# Patient Record
Sex: Female | Born: 1970
Health system: Western US, Academic
[De-identification: ages and names within clinical notes are randomized; demographics above are authoritative.]

## PROBLEM LIST (undated history)

## (undated) DIAGNOSIS — M1990 Unspecified osteoarthritis, unspecified site: Secondary | ICD-10-CM

## (undated) HISTORY — PX: FOOT SURGERY: SHX648

## (undated) HISTORY — DX: Unspecified osteoarthritis, unspecified site: M19.90

---

## 1998-04-16 ENCOUNTER — Encounter: Admission: RE | Admit: 1998-04-16 | Discharge: 1998-04-16 | Payer: Self-pay | Admitting: *Deleted

## 1999-12-22 ENCOUNTER — Other Ambulatory Visit: Admission: RE | Admit: 1999-12-22 | Discharge: 1999-12-22 | Payer: Self-pay | Admitting: Obstetrics and Gynecology

## 2001-01-14 ENCOUNTER — Other Ambulatory Visit: Admission: RE | Admit: 2001-01-14 | Discharge: 2001-01-14 | Payer: Self-pay | Admitting: Obstetrics and Gynecology

## 2002-06-23 ENCOUNTER — Other Ambulatory Visit: Admission: RE | Admit: 2002-06-23 | Discharge: 2002-06-23 | Payer: Self-pay | Admitting: Obstetrics and Gynecology

## 2003-11-23 ENCOUNTER — Other Ambulatory Visit: Admission: RE | Admit: 2003-11-23 | Discharge: 2003-11-23 | Payer: Self-pay | Admitting: Obstetrics and Gynecology

## 2005-02-13 ENCOUNTER — Other Ambulatory Visit: Admission: RE | Admit: 2005-02-13 | Discharge: 2005-02-13 | Payer: Self-pay | Admitting: Obstetrics and Gynecology

## 2011-01-10 ENCOUNTER — Inpatient Hospital Stay (INDEPENDENT_AMBULATORY_CARE_PROVIDER_SITE_OTHER)
Admission: RE | Admit: 2011-01-10 | Discharge: 2011-01-10 | Disposition: A | Discharge status: Home / Self Care | Payer: BC Managed Care – PPO | Source: Ambulatory Visit | Attending: Family Medicine | Admitting: Family Medicine

## 2011-01-10 DIAGNOSIS — G510 Bell's palsy: Secondary | ICD-10-CM

## 2015-11-09 ENCOUNTER — Other Ambulatory Visit: Payer: Self-pay | Admitting: Obstetrics and Gynecology

## 2015-11-09 DIAGNOSIS — R928 Other abnormal and inconclusive findings on diagnostic imaging of breast: Secondary | ICD-10-CM

## 2015-11-26 ENCOUNTER — Ambulatory Visit
Admission: RE | Admit: 2015-11-26 | Discharge: 2015-11-26 | Disposition: A | Discharge status: Home / Self Care | Payer: BLUE CROSS/BLUE SHIELD | Source: Ambulatory Visit | Attending: Obstetrics and Gynecology | Admitting: Obstetrics and Gynecology

## 2015-11-26 DIAGNOSIS — R928 Other abnormal and inconclusive findings on diagnostic imaging of breast: Secondary | ICD-10-CM

## 2016-01-24 ENCOUNTER — Ambulatory Visit (HOSPITAL_COMMUNITY)
Admission: EM | Admit: 2016-01-24 | Discharge: 2016-01-24 | Disposition: A | Discharge status: Home / Self Care | Payer: BLUE CROSS/BLUE SHIELD | Attending: Family Medicine | Admitting: Family Medicine

## 2016-01-24 ENCOUNTER — Encounter (HOSPITAL_COMMUNITY): Payer: Self-pay | Admitting: Emergency Medicine

## 2016-01-24 DIAGNOSIS — L259 Unspecified contact dermatitis, unspecified cause: Secondary | ICD-10-CM

## 2016-01-24 MED ORDER — TRIAMCINOLONE ACETONIDE 40 MG/ML IJ SUSP
40.0000 mg | Freq: Once | INTRAMUSCULAR | Status: AC
  Administered 2016-01-24: 40 mg via INTRAMUSCULAR

## 2016-01-24 MED ORDER — PREDNISONE 50 MG PO TABS: ORAL_TABLET | ORAL | Status: DC

## 2016-01-24 MED ORDER — TRIAMCINOLONE ACETONIDE 40 MG/ML IJ SUSP
INTRAMUSCULAR | Status: AC
  Filled 2016-01-24: qty 1

## 2016-01-24 MED ORDER — TRIAMCINOLONE ACETONIDE 0.1 % EX CREA: 1.0000 "application " | TOPICAL_CREAM | Freq: Two times a day (BID) | CUTANEOUS | Status: DC

## 2016-01-24 NOTE — ED Notes (Signed)
The patient [facility] with a complaint of a rash that started on her face on Dec 23, 2015 that she treated with calamine lotion and benadryl. The patient stated that the calamine lotion started to burn so she switched to a hydrocortisone cream. She stated that the rash has now spread to her chest and neck.

## 2016-01-24 NOTE — ED Provider Notes (Signed)
CSN: [id number]     Arrival date & time 01/24/16  1557 History   First MD Initiated Contact with Patient 01/24/16 1720     Chief Complaint  Patient presents with  . Rash   (Consider location/radiation/quality/duration/timing/severity/associated sxs/prior Treatment) Patient is a 45 y.o. female presenting with rash. The history is provided by the patient.  Rash Location:  Face, torso, shoulder/arm and head/neck Head/neck rash location:  R neck and L neck Facial rash location:  Face Shoulder/arm rash location:  R forearm and L forearm Quality: dryness, itchiness and scaling   Severity:  Moderate Duration:  1 month Progression:  Worsening Chronicity:  New Ineffective treatments:  Antihistamines Associated symptoms: no fever, no sore throat, no throat swelling and no tongue swelling     History reviewed. No pertinent past medical history. Past Surgical History  Procedure Laterality Date  . Foot surgery     History reviewed. No pertinent family history. Social History  Substance Use Topics  . Smoking status: Never Smoker   . Smokeless tobacco: None  . Alcohol Use: No   OB History    No data available     Review of Systems  Constitutional: Negative.  Negative for fever.  HENT: Negative for sore throat.   Musculoskeletal: Negative.   Skin: Positive for rash. Negative for wound.  All other systems reviewed and are negative.   Allergies  Penicillins  Home Medications   Prior to Admission medications   Medication Sig Start Date End Date Taking? Authorizing Provider  predniSONE (DELTASONE) 50 MG tablet 1 tab daily for 2 days then 1/2 tab daily for 2 days. Start on tues, take until finished 01/24/16   Linna HoffJames D Kindl, MD  triamcinolone cream (KENALOG) 0.1 % Apply 1 application topically 2 (two) times daily. 01/24/16   Linna HoffJames D Kindl, MD   Meds Ordered and Administered this Visit   Medications  triamcinolone acetonide (KENALOG-40) injection 40 mg (not administered)    BP  153/96 mmHg  Pulse 71  Temp(Src) 98.2 F (36.8 C) (Oral)  Resp 18  SpO2 99% No data found.   Physical Exam  Constitutional: She is oriented to person, place, and time. She appears well-developed and well-nourished. No distress.  HENT:  Mouth/Throat: Oropharynx is clear and moist.  Neck: Normal range of motion. Neck supple.  Musculoskeletal: Normal range of motion.  Lymphadenopathy:    She has no cervical adenopathy.  Neurological: She is alert and oriented to person, place, and time.  Skin: Skin is warm and dry. Rash noted.  Dry scaly lesions to affected skin nontender, no infection.  Nursing note and vitals reviewed.   ED Course  Procedures (including critical care time)  Labs Review Labs Reviewed - No data to display  Imaging Review No results found.   Visual Acuity Review  Right Eye Distance:   Left Eye Distance:   Bilateral Distance:    Right Eye Near:   Left Eye Near:    Bilateral Near:         MDM   1. Contact dermatitis        Linna HoffJames D Kindl, MD 01/24/16 1759

## 2016-02-09 ENCOUNTER — Ambulatory Visit (INDEPENDENT_AMBULATORY_CARE_PROVIDER_SITE_OTHER): Payer: BLUE CROSS/BLUE SHIELD | Admitting: Family Medicine

## 2016-02-09 ENCOUNTER — Encounter: Payer: Self-pay | Admitting: Family Medicine

## 2016-02-09 DIAGNOSIS — Z13220 Encounter for screening for lipoid disorders: Secondary | ICD-10-CM

## 2016-02-09 DIAGNOSIS — Z6833 Body mass index (BMI) 33.0-33.9, adult: Secondary | ICD-10-CM | POA: Diagnosis not present

## 2016-02-09 DIAGNOSIS — Z23 Encounter for immunization: Secondary | ICD-10-CM

## 2016-02-09 DIAGNOSIS — Z131 Encounter for screening for diabetes mellitus: Secondary | ICD-10-CM

## 2016-02-09 DIAGNOSIS — Z0000 Encounter for general adult medical examination without abnormal findings: Secondary | ICD-10-CM | POA: Diagnosis not present

## 2016-02-09 NOTE — Addendum Note (Signed)
Addended by: Marcell AngerSELF, Danine Hor E on: 02/09/2016 08:46 AM   Modules accepted: Orders

## 2016-02-09 NOTE — Patient Instructions (Signed)
A few things to remember from today's visit:   Routine physical examination - Plan: Lipid panel, Basic metabolic panel  Lipid screening - Plan: Lipid panel  Diabetes mellitus screening - Plan: Basic metabolic panel     At least 150 minutes of moderate exercise per week, daily brisk walking for 15-30 min is a good exercise option. Healthy diet low in saturated (animal) fats and sweets and consisting of fresh fruits and vegetables, lean meats such as fish and white chicken and whole grains.   - Vaccines:  Tdap vaccine every 10 years.  Shingles vaccine recommended at age 45, could be given after 45 years of age but not sure about insurance coverage.  Pneumonia vaccines:  Prevnar 13 at 65 and Pneumovax at 66.  Screening recommendations for low/normal risk women:  Screening for diabetes at age 45-45 and every 3 years.  Cervical cancer prevention:   Pap smear every 3 years between women 30 and older if pap smear negative and HPV screening negative.   -Breast cancer: Mammogram: There is disagreement between experts about when to start screening in low risk asymptomatic female (40-45-50 years). > 45 years old every 2 years.   Colon cancer screening: starts at 45 years old in your case until 45 years old.  Cholesterol disorder screening at age 45-45 and every 3 years.    If a new problem present, please set up appointment sooner than planned today.

## 2016-02-09 NOTE — Progress Notes (Signed)
HPI:   [name] is a 45 y.o. female, who is here today to establish care with me. She is also having her routine preventive visit today.   Former PCP: has not had one before. Last preventive routine visit: 10/2015 gyn exam (Dr Marcelle Overlie), has not had CPE in years.   She is otherwise healthy, she is on Depo-Provera, which she has taken intermittently for about 15 years for contraception. No other chronic medication.  Pap smear 10/2015. Mammogram 10/2015.  Lipid and diabetes screening never. No tobacco use. No high alcohol intake.  She does not  follow a healthy diet and does not exercise regularly. Lives with husband, no children.  FHx for colon cancer negative. FHx for breast cancer maternal aunt.  Concerns today: None  She is not fasting today, drank a Mountain dew today.     Review of Systems  Constitutional: Negative for fever, appetite change, fatigue and unexpected weight change.  HENT: Negative for hearing loss, mouth sores, nosebleeds, trouble swallowing and voice change.   Eyes: Negative for photophobia and visual disturbance.  Respiratory: Negative for cough, shortness of breath and wheezing.   Cardiovascular: Negative for chest pain, palpitations and leg swelling.  Gastrointestinal: Negative for nausea, vomiting and abdominal pain.       No changes in bowel habits.  Endocrine: Negative for cold intolerance, heat intolerance, polydipsia, polyphagia and polyuria.  Genitourinary: Negative for dysuria, hematuria, decreased urine volume, vaginal bleeding and vaginal discharge.       LMP 11/21/2015, on Depo Provera.  Musculoskeletal: Negative for back pain, arthralgias and neck pain.  Skin: Negative for color change and rash.  Allergic/Immunologic: Negative for environmental allergies and food allergies.  Neurological: Negative for seizures, syncope, weakness, numbness and headaches.  Psychiatric/Behavioral: Negative for confusion and sleep disturbance.  The patient is not nervous/anxious.   All other systems reviewed and are negative.     Current Outpatient Prescriptions on File Prior to Visit  Medication Sig Dispense Refill  . triamcinolone cream (KENALOG) 0.1 % Apply 1 application topically 2 (two) times daily. 45 g 0   No current facility-administered medications on file prior to visit.     History reviewed. No pertinent past medical history. Allergies  Allergen Reactions  . Penicillins     Family History  Problem Relation Age of Onset  . Hypertension Mother   . Diabetes Mother   . Hypertension Father   . Cancer Maternal Aunt   . Diabetes Paternal Aunt   . Diabetes Maternal Grandmother   . Hyperlipidemia Paternal Grandmother   . Diabetes Paternal Grandmother     Social History   Social History  . Marital Status: Married    Spouse Name: N/A  . Number of Children: N/A  . Years of Education: N/A   Social History Main Topics  . Smoking status: Never Smoker   . Smokeless tobacco: None  . Alcohol Use: No  . Drug Use: None  . Sexual Activity: Not Asked   Other Topics Concern  . None   Social History Narrative    Filed Vitals:   02/09/16 0749  BP: 125/80  Pulse: 66  Temp: 98.8 F (37.1 C)  Resp: 12    Body mass index is 33.65 kg/(m^2).   SpO2 Readings from Last 3 Encounters:  02/09/16 98%  01/24/16 99%      Physical Exam  Constitutional: She is oriented to person, place, and time. She appears well-developed. No distress.  HENT:  Head:  Atraumatic.  Right Ear: Hearing, tympanic membrane, external ear and ear canal normal.  Left Ear: Hearing, tympanic membrane, external ear and ear canal normal.  Mouth/Throat: Uvula is midline, oropharynx is clear and moist and mucous membranes are normal.  Eyes: Conjunctivae and EOM are normal. Pupils are equal, round, and reactive to light.  Neck: No tracheal deviation present. No thyromegaly present.  Cardiovascular: Normal rate and regular rhythm.   No  murmur heard. Pulses:      Dorsalis pedis pulses are 2+ on the right side, and 2+ on the left side.       Posterior tibial pulses are 2+ on the right side, and 2+ on the left side.  Respiratory: Effort normal and breath sounds normal. No respiratory distress.  GI: Soft. She exhibits no mass. There is no tenderness.  Musculoskeletal: She exhibits no edema.  No major deformity or sing of synovitis appreciated.  Lymphadenopathy:    She has no cervical adenopathy.       Right: No supraclavicular adenopathy present.       Left: No supraclavicular adenopathy present.  Neurological: She is alert and oriented to person, place, and time. She has normal strength. No cranial nerve deficit. Coordination and gait normal.  Reflex Scores:      Bicep reflexes are 2+ on the right side and 2+ on the left side.      Patellar reflexes are 2+ on the right side and 2+ on the left side. Skin: Skin is warm. No erythema.  Psychiatric: Her speech is normal. Her mood appears anxious.      ASSESSMENT AND PLAN:     Theresa Mitchell was seen today for new patient (initial visit).  Diagnoses and all orders for this visit:  Routine physical examination   We discussed the importance of regular physical activity and healthy diet for prevention of chronic illness and/or complications. Preventive guidelines reviewed. She will continue female preventive care with Dr Marcelle OverlieHolland. Some general recommendations about vaccination discussed. Adequate Ca++ supplementation , 1000 mg daily, PPI daily through diet, given the fact she has been on Depo-Provera for years. Next CPE in 1 year.  -     Lipid panel; Future -     Basic metabolic panel; Future  Lipid screening -     Lipid panel; Future  Diabetes mellitus screening -     Basic metabolic panel; Future  BMI 33.0-33.9,adult We discussed benefits of wt loss as well as adverse effects of obesity. Consistency with healthy diet and physical activity recommended. Weight  Watchers is a good option as well as daily brisk walking for 15-30 min as tolerated.      She is not fasting today, so she will be back for fasting lab in about 2-3 weeks. I will continue seeing her annually, before if needed.         Armie Moren G. [place], MD  [facility]. [place] office.

## 2016-02-09 NOTE — Progress Notes (Signed)
Pre visit review using our clinic review tool, if applicable. No additional management support is needed unless otherwise documented below in the visit note. 

## 2016-03-08 ENCOUNTER — Other Ambulatory Visit: Payer: BLUE CROSS/BLUE SHIELD

## 2016-03-20 ENCOUNTER — Encounter: Payer: Self-pay | Admitting: Family Medicine

## 2017-08-04 IMAGING — MG MM DIAG BREAST TOMO UNI RIGHT
6 series · 6 of 14 positions shown · non-contrast
Comparison: Screening study, 11/04/2015

CLINICAL DATA: Screening recall for a possible asymmetry in the
right breast.

EXAM:
2D DIGITAL DIAGNOSTIC UNILATERAL RIGHT MAMMOGRAM WITH CAD AND
ADJUNCT TOMO

[R CC synth-2D]
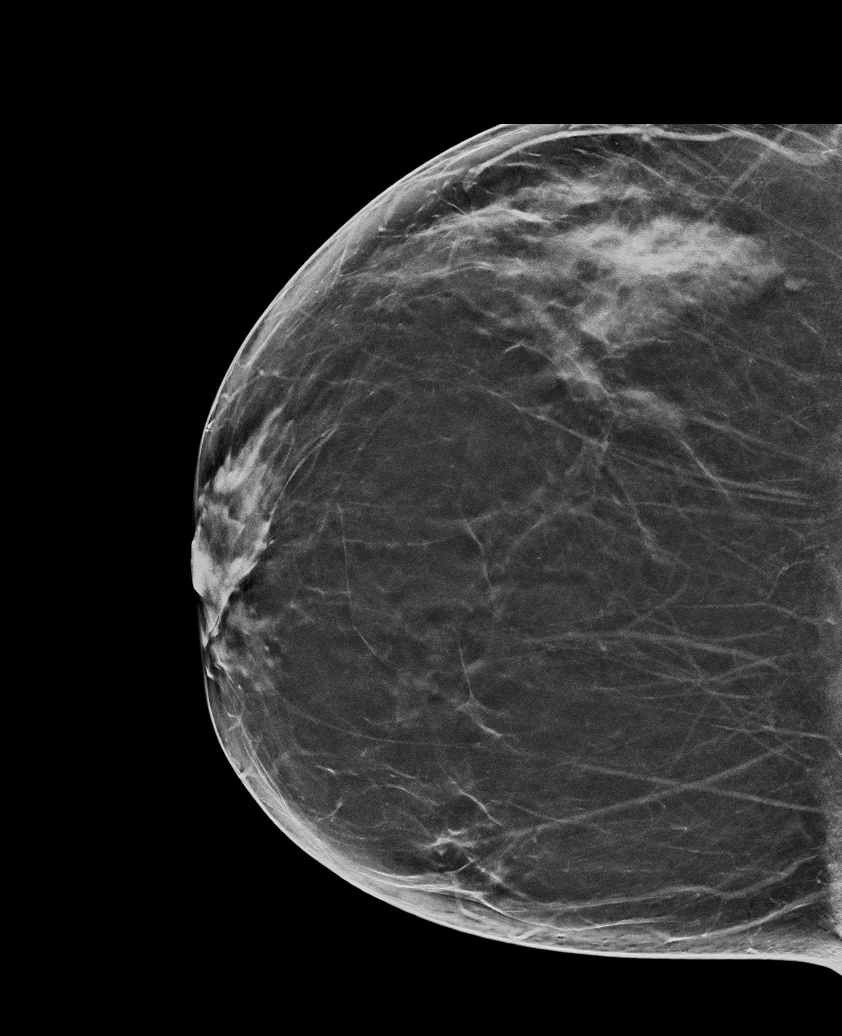

[R MLO]
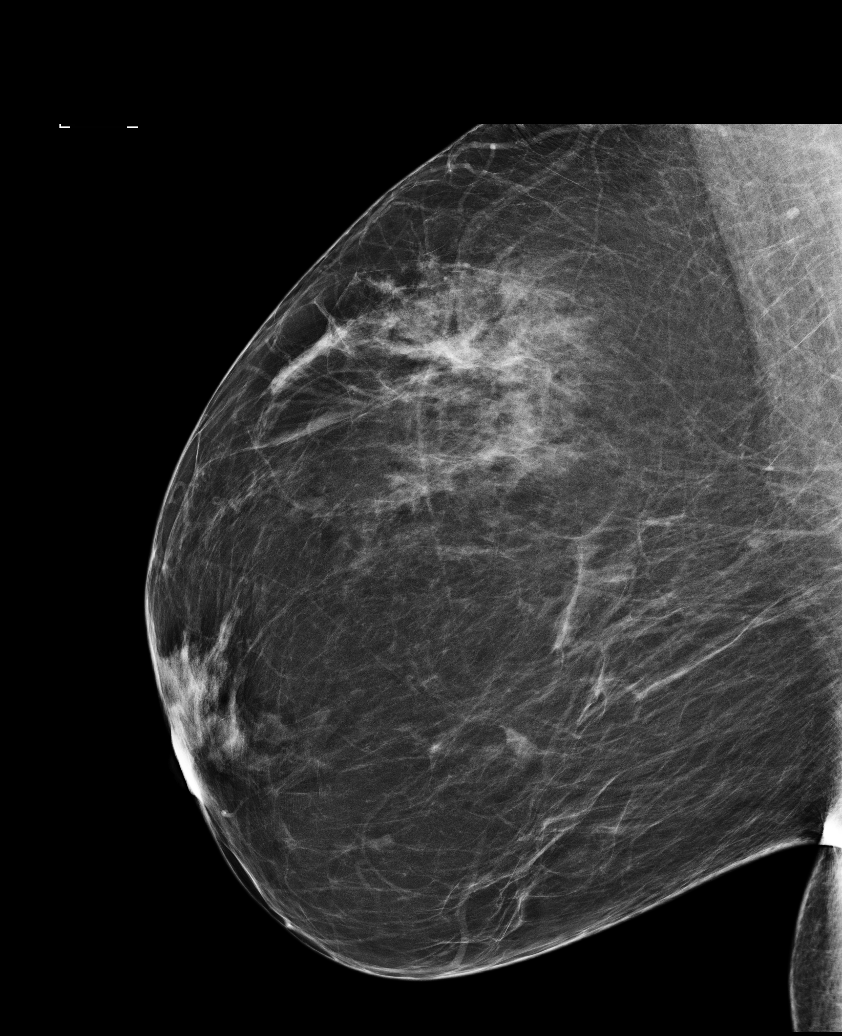

[R MLO synth-2D]
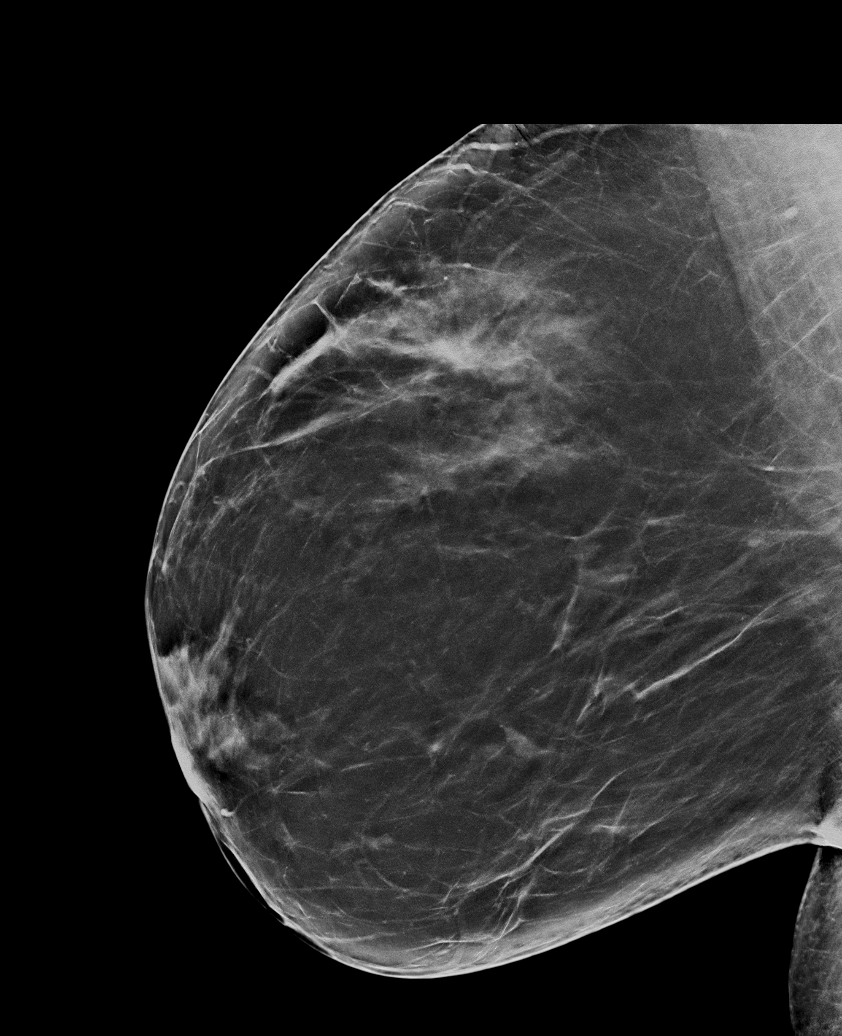

[R CC]
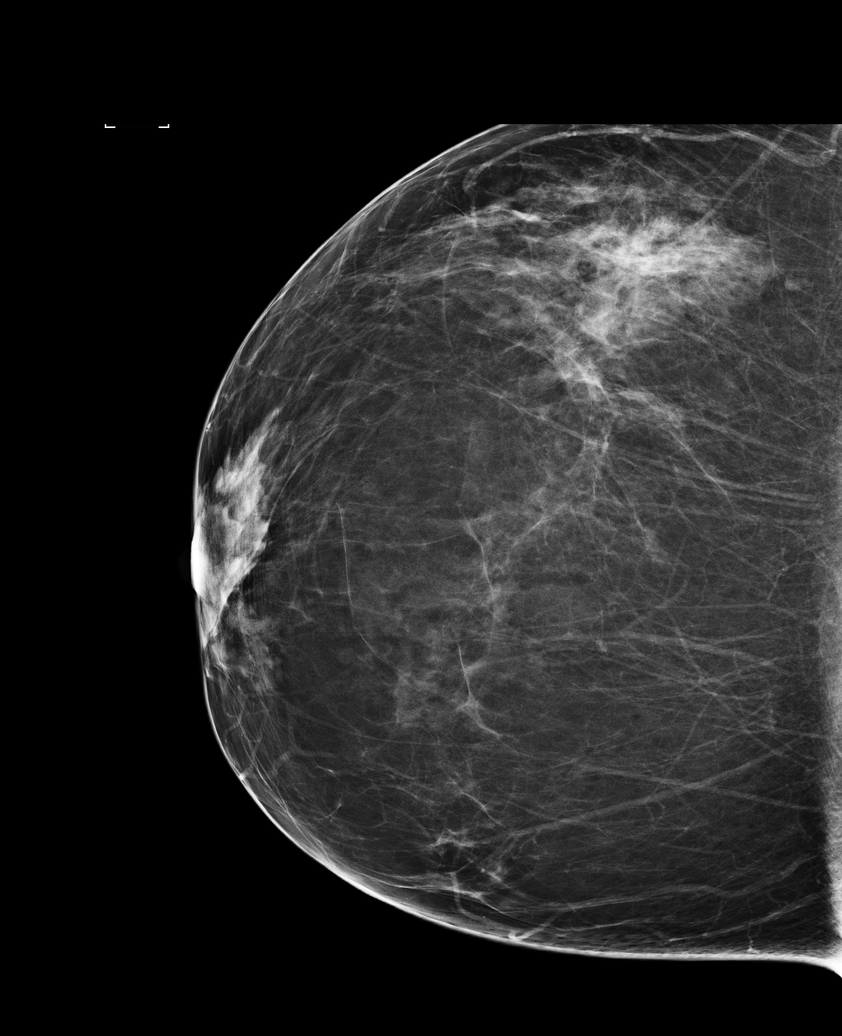

[R CC tomo · tomo slice 37/74.0]
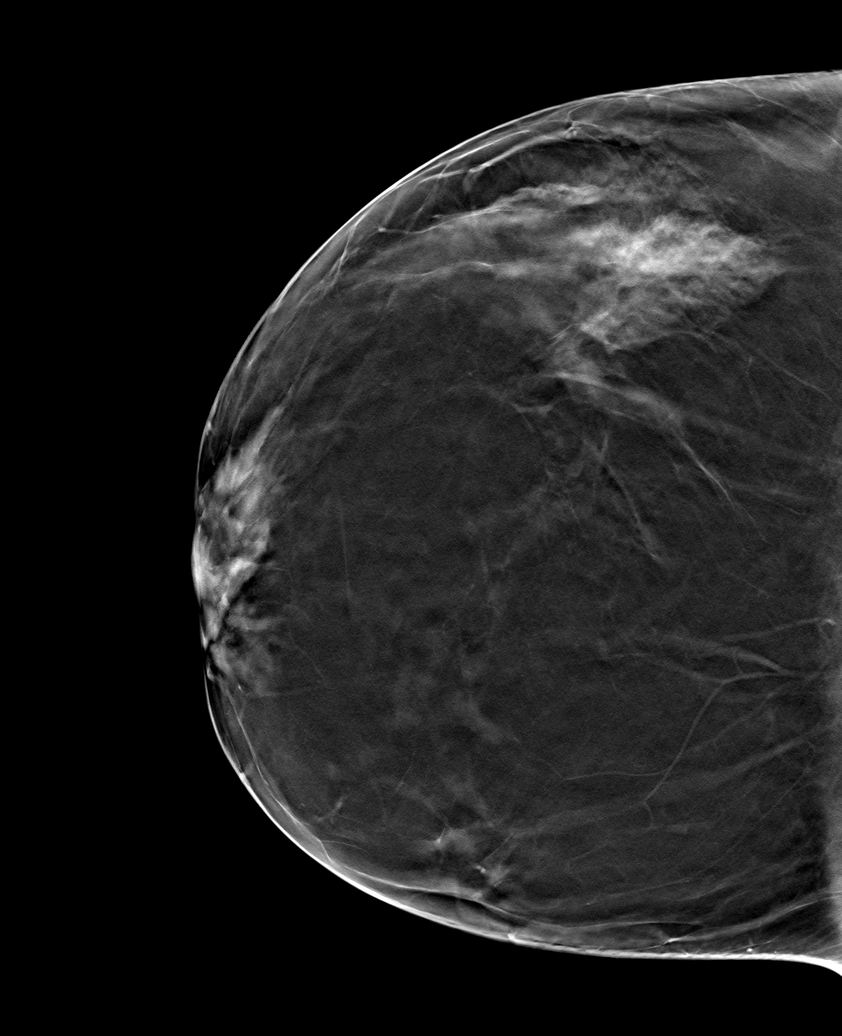

[R MLO tomo · tomo slice 41/82.0]
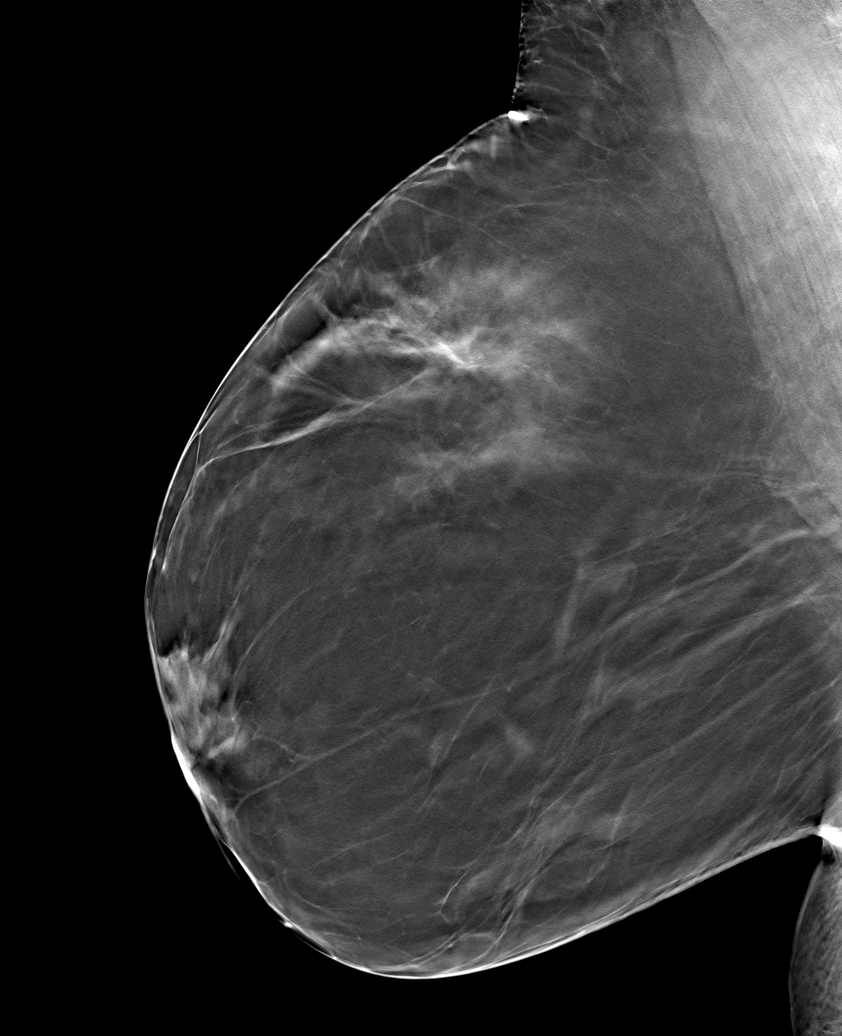

[6 of 14 positions shown; findings below may reference images not displayed]

ACR Breast Density Category b: There are scattered areas of
fibroglandular density.
FINDINGS: The possible asymmetry seen on the current screening study is not
reproduced on the diagnostic 2D or 3D images. There are no discrete
masses, areas of architectural distortion areas of significant
asymmetry or suspicious calcifications.

Mammographic images were processed with CAD.
IMPRESSION: No evidence of malignancy. Possible asymmetry seen on the screening
study was a superimposition artifact.

RECOMMENDATION:
Screening mammogram in one year.(Code:EC-[id number])

I have discussed the findings and recommendations with the patient.
Results were also provided in writing at the conclusion of the
visit. If applicable, a reminder letter will be sent to the patient
regarding the next appointment.

BI-RADS CATEGORY  1: Negative.

## 2018-01-10 ENCOUNTER — Encounter: Payer: Self-pay | Admitting: Podiatry

## 2018-01-10 ENCOUNTER — Ambulatory Visit (INDEPENDENT_AMBULATORY_CARE_PROVIDER_SITE_OTHER): Payer: BLUE CROSS/BLUE SHIELD

## 2018-01-10 ENCOUNTER — Ambulatory Visit (INDEPENDENT_AMBULATORY_CARE_PROVIDER_SITE_OTHER): Payer: BLUE CROSS/BLUE SHIELD | Admitting: Podiatry

## 2018-01-10 DIAGNOSIS — M779 Enthesopathy, unspecified: Secondary | ICD-10-CM

## 2018-01-10 DIAGNOSIS — Q6650 Congenital pes planus, unspecified foot: Secondary | ICD-10-CM | POA: Diagnosis not present

## 2018-01-10 MED ORDER — METHYLPREDNISOLONE 4 MG PO TBPK: ORAL_TABLET | ORAL | 0 refills | Status: DC

## 2018-01-10 MED ORDER — MELOXICAM 15 MG PO TABS: 15.0000 mg | ORAL_TABLET | Freq: Every day | ORAL | 3 refills | Status: DC

## 2018-01-10 NOTE — Progress Notes (Signed)
  Subjective:  Patient ID: Theresa Mitchell, female    DOB: 10/15/1970,  MRN: [id number] HPI Chief Complaint  Patient presents with  . Ankle Pain    Lateral ankle left - burning, stinging, swelling x years, flat feet, previous foot surgery (bunion), feels weak, no injury, AM tightness and stiff, muscle spasms in leg  . New Patient (Initial Visit)    47 y.o. female presents with the above complaint.   ROS: Denies fever chills nausea vomiting muscle aches pains calf pain back pain chest pain shortness of breath.  No past medical history on file. Past Surgical History:  Procedure Laterality Date  . FOOT SURGERY      Current Outpatient Medications:  .  medroxyPROGESTERone (DEPO-PROVERA) 150 MG/ML injection, Inject 150 mg into the muscle every 3 (three) months., Disp: , Rfl:  .  meloxicam (MOBIC) 15 MG tablet, Take 1 tablet (15 mg total) by mouth daily., Disp: 30 tablet, Rfl: 3 .  methylPREDNISolone (MEDROL DOSEPAK) 4 MG TBPK tablet, 6 day dose pack - take as directed, Disp: 21 tablet, Rfl: 0  Allergies  Allergen Reactions  . Penicillins    Review of Systems Objective:  There were no vitals filed for this visit.  General: Well developed, nourished, in no acute distress, alert and oriented x3   Dermatological: Skin is warm, dry and supple bilateral. Nails x 10 are well maintained; remaining integument appears unremarkable at this time. There are no open sores, no preulcerative lesions, no rash or signs of infection present.  Vascular: Dorsalis Pedis artery and Posterior Tibial artery pedal pulses are 2/4 bilateral with immedate capillary fill time. Pedal hair growth present. No varicosities and no lower extremity edema present bilateral.   Neruologic: Grossly intact via light touch bilateral. Vibratory intact via tuning fork bilateral. Protective threshold with Semmes Wienstein monofilament intact to all pedal sites bilateral. Patellar and Achilles deep tendon reflexes 2+  bilateral. No Babinski or clonus noted bilateral.   Musculoskeletal: No gross boney pedal deformities bilateral. No pain, crepitus, or limitation noted with foot and ankle range of motion bilateral. Muscular strength 5/5 in all groups tested bilateral.  She has pain on palpation of the peroneal tendons and tenderness overlying the left.  It appears to be more of an allodynic type symptomatology over the fibular malleolus but deep pain on palpation of the peroneal tendons though they are functional.  It does not appear that it is a peroneal spastic flatfoot.  Gait: Unassisted, Nonantalgic.    Radiographs:  Radiographs taken today demonstrate 3 views of the office osseously mature individual severe pes planus.  K wires to the first metatarsal left foot.  No major osseous abnormalities other than the pes planus.   Assessment & Plan:   Assessment: Peroneal tendinitis left pes planus bilateral.  Plan: Discussed etiology pathology conservative or surgical therapies after sterile Betadine skin prep injected 20 mg Kenalog 5 mg Marcaine along the peroneal tendons.  After the anesthetic set up she stated that he felt better.  Start her on a Medrol Dosepak to be followed by meloxicam.  Discussed appropriate shoe gear stretching exercise and ice therapy.  I will follow-up with her in 1 month consider MRI if necessary need to consider orthotics as well.      T. HammonHyatt, [place] Dakota[place]PM

## 2018-02-14 ENCOUNTER — Ambulatory Visit: Payer: BLUE CROSS/BLUE SHIELD | Admitting: Podiatry

## 2018-12-12 ENCOUNTER — Telehealth: Payer: PRIVATE HEALTH INSURANCE

## 2018-12-12 ENCOUNTER — Ambulatory Visit: Payer: PRIVATE HEALTH INSURANCE | Attending: Hematology & Oncology

## 2018-12-12 DIAGNOSIS — Z170 Estrogen receptor positive status [ER+]: Secondary | ICD-10-CM

## 2018-12-12 DIAGNOSIS — D0511 Intraductal carcinoma in situ of right breast: Secondary | ICD-10-CM

## 2018-12-12 DIAGNOSIS — R922 Inconclusive mammogram: Secondary | ICD-10-CM

## 2018-12-12 DIAGNOSIS — C50911 Malignant neoplasm of unspecified site of right female breast: Secondary | ICD-10-CM

## 2018-12-12 NOTE — Telephone Encounter
Reply by: Assunta Curtis to patient and she would like the Genetic test to be  done in our office and for our office to contact her INS & give  her a call.    Thank you

## 2018-12-12 NOTE — Telephone Encounter
Jenna Newman's [place] office will contact patient to schedule. I have contacted office through  email. Patient is aware of this.    Thank you

## 2018-12-12 NOTE — Telephone Encounter
Call Back Request    MD:  Dr. Lutricia Feil    Reason for call back: Pt is requesting a call back from [place] in regards to her genetic testing. She states she has a tele visit scheduled on 5/27 and after that visit they will send her the testing kit which will take about 3 weeks to process so she prefers to do testing in the office to expedite everything.     Any Symptoms:  []  Yes  [x]  No      ? If yes, what symptoms are you experiencing:    o Duration of symptoms (how long):    o Have you taken medication for symptoms (OTC or Rx):      Patient or caller has been notified of the 24-48 hour turnaround time.

## 2018-12-12 NOTE — Consults
Patient name:  Jenna Newman  MRN:  [id number]  DOB:  02-02-1971  CSN: [id number]  Date of encounter: 12/12/2018  Referring provider: No ref. provider found  PCP: Melanie Crazier, MD  Provider: Alric Seton. 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     Briefly, she is G2P2 (1 son, 1 daughter), menarche age 37, last ''normal'' period following ablation, still with monthly menstrual symptoms and slight spotting, first pregnant at age 89, breast fed both babies for about 6 months, formerly on and off OCP's and the Mirena IUD, never on hormone replacement. She has a history of bilateral breast fibroadenomas diagnosed at age 72. Since then, no additional breast biopsies until her recent procedure.     She started noting a right sided breast mass about 6 months ago, originally felt it was likely a fibroadenoma, which caused her to take some time before presenting to Dr. Richardson Dopp regarding this issue. A diagnostic bilateral tomosynthesis [facility]gram at [facility] on 11/27/18 noted heterogeneously dense breast tissue. There was a distortion in the RUO breast peri-areolar region without suspicious calcifications. A targeted right breast ultrasound noted a 15 x 16 x 22 mm mass at 11:00, 6 cm from the nipple, with a 1.4 cm extension of the mass off the superior aspect appearing continuous by a thin bridge of tissue. No frank right axillary adenopathy was identified. Of note, her last mammogram before this was 2016. Given life stressors including a divorce, she did not follow through with routine breast imaging.     On 12/06/18, she had an ultrasound guided right breast biopsy revealing an invasive ductal carcinoma measuring at least 8 mm, grade 2/3, SBR 7/9, ER/PR > 95%, Ki 67 25%, HER-2 FISH pending, with background grade 3 DCIS, cribriform pattern. No LCIS, no LVI, no microcalcifications were seen.     No family members with history of cancer. No Ashkenazi Jewish ancestry.     Patient is a Engineer, civil (consulting). She does not drink or use tobacco.    Besides some anxiety regarding her diagnosis, she feels well and has no physical complaints.  No headaches, vision changes, bladder or bowel changes, cardiopulmonary issues, bone pain, nausea, vomiting, anorexia or unintentional weight loss.     ECOG performance status: 0    Objective:  Current medications:  Outpatient Medications Prior to Visit   Medication Sig   ??? ALPRAZolam 0.5 mg tablet Take 1 tablet by mouth as needed for for Sleep.   ??? cetirizine 10 mg tablet Take 10 mg by mouth every other day.     No facility-administered medications prior to visit.        Allergies:  No Known Allergies    Review of Systems:  A 14 point review of systems was negative besides what I note in my HPI above.     Past Medical History:  Past Medical History:   Diagnosis Date   ??? Breast cancer (HCC/RAF)        Past Surgical History:  Past Surgical History:   Procedure Laterality Date   ??? BREAST FIBROADENOMA SURGERY Bilateral 1992   ??? CESAREAN SECTION  2006 & 2008   ??? CHOLECYSTECTOMY  2004   ??? KNEE SURGERY Left 2017    Ligament release    ???  TONSILECTOMY, ADENOIDECTOMY, BILATERAL MYRINGOTOMY AND TUBES  1983   ??? TUBAL LIGATION  2016       Social History:  Social History     Tobacco Use   ??? Smoking status: Never Smoker   ??? Smokeless tobacco: Never Used   Substance Use Topics   ??? Alcohol use: Not Currently     Family History:  No family history on file.    Vitals:  BP: 109/72 (05/14 0830)  Temp: 36.3 ???C (97.3 ???F) (05/14 0830)  Temp Source: Forehead (05/14 0830)  Heart Rate: 72 (05/14 0830)  Resp: 16 (05/14 0830)  SpO2: 98 % (05/14 0830)  Height: 160.6 cm (05/14 0830) Weight: 61.5 kg (135 lb 9.6 oz) (05/14 0830)    Physical Exam:  General: Appears well-developed, well-nourished and close to stated age.   Head: Normocephalic, atraumatic.  Eyes: PERRL without icterus.   ENT: Oropharynx is clear, mucus membranes are moist.    CV: Regular in rate and rhythm, no murmurs or gallops.   Chest: Clear to auscultation bilaterally without wheezing or rhonchi.  Respiratory effort appears normal.   Abdomen: Soft, nontender and nondistended. Bowel sounds are present and normoactive. No organomegaly is appreciated  Musculoskeletal: No edema. No cyanosis. Extremities are warm and well-perfused.   Hematologic: No bruising, purpura or petechiae are noted.   Dermatologic: No rashes appreciated.   Lymphatic: No palpable cervical, supraclavicular, axillary or inguinal adenopathy appreciated.   Psychiatric: Affect appropriate.  Pleasant and conversant  Breast: Left breast negative, RIGHT breast with about 3 x 3 cm mass felt just lateral and superior to nipple    Recent Labs:  No results found for: NA, K, CL, CO2, CREAT, BUN, GLUCOSE, CALCIUM, MG, PHOS  No results found for: WBC, HGB, HCT, MCV, PLT, NEUTPCT, NEUTABS, MONOPCT, MONOABS  No results found for: TOTPRO, ALBUMIN, BILITOT, BILICON, AST, ALT, ALKPHOS, GGT, AMYLASE, LIPASE, AMMONIA    Pertinent Imaging:  Per HPI     Pertinent Pathology:  Per HPI     Impression and Recommendations:  Jenna Newman is a 48 y.o. female with     1. Clinical Stage IIA cT2 (2.2 cm) cN0 cM0 RIGHT breast ductal cancer, ER/PR > 95%, HER-2 FISH PENDING, Ki 67 25%, grade 2/3, SBR 7/9, with background grade 3 DCIS, cribriform pattern. No LCIS, no LVI, no microcalcifications were seen.  2. Dense breast tissue on mammogram     Today we reviewed, in general, the diagnosis, associated prognosis and general treatment paradigm/options for those with early stage breast cancer, speaking to the changes in prognosis and upfront systemic therapy should we have HER-2 negative vs positive disease. I have discussed the case with pathology and expect to have her HER-2 FISH back by early next week.     We discussed prognostic factors including lymph node status, tumor size and histology in addition to hormonal and HER-2 expression, grade, presence/absence of lymph-vascular invasion, etc.     Given her dense breast tissue, I am requesting a bilateral MRI of her breasts with a breast surgery consultation thereafter. We discussed in general the equivalent outcomes between lumpectomy followed by radiation vs mastectomy, if breast conservation is a possibility.     Given her age, I am requesting a genetics consultation to rule out a hereditary breast cancer syndrome. Although unlikely, should we confirm an underlying germline predisposition, this would argue for bilateral mastectomy at this time.     We will obtain baseline labs including tumor markers, per her request, today.  I have outlined the absolute recommendation for endocrine therapy with tamoxifen moving forward given her hormone positive disease. Should she be HER-2 negative, I would favor a surgical approach upfront followed by pathologic review and consideration of genomic analysis from her tumor specimen to ascertain tumor specific prognostic and predictive information regarding the benefit, or lack thereof, for adjuvant cytotoxic chemotherapy.     Thank you for allowing me to take part in Jenna Newman's care Dr. Richardson Dopp. I will see Jenna Newman back after her breast MRI and surgical consultation to finalize our plan in the near future. I will also reach out to [facility] after I get the HER-2 results back.     Return to clinic:  Return for see MD after MRI and breast surgeon visit .    Orders:  Orders Placed This Encounter   ??? MR breast wo+w contrast bilat   ??? Comprehensive Metabolic Panel   ??? CBC & Auto Differential   ??? CA27.29   ??? CEA   ??? Referral to Genetics   ??? Referral to Surgery   ??? cetirizine 10 mg tablet ??? ALPRAZolam 0.5 mg tablet       Alric Seton. Zoila Shutter, MD  Assistant Professor of Medicine  Division of Hematology/Oncology  Phone: [phone]  Fax: [phone]  Email: [email or web address]    Attending Physician: Alric Seton. Zoila Shutter, MD.  Author: Alric Seton. Zoila Shutter, MD

## 2018-12-12 NOTE — Telephone Encounter
We can take care of those labs at her return visit.     Is she asking [name] to place the genetic testing order ([facility]) today, or is she going to wait for the geneticist to order the study?    Thanks.

## 2018-12-12 NOTE — Telephone Encounter
MRI Breast @ Jenna Newman 12/25/18 @ 3:15PM    Schedule with Katharine Look. She advised if AUTH is obtained prior   To scheduled appt she will call patient to move to sooner appt.    Patient aware    All forms FX 12/12/18    FX [phone] ~ [place] [phone]    Thank you

## 2018-12-12 NOTE — Telephone Encounter
Let me know if questions

## 2018-12-12 NOTE — Telephone Encounter
Patient will be coming in to clinic to have test done.

## 2018-12-12 NOTE — Telephone Encounter
Forwarded by: Roxanne Panek Natalie Tonesha Tsou

## 2018-12-12 NOTE — Telephone Encounter
Dr. Morrison Old consult 12/31/18 @ 10AM    Scheduled with Claiborne Billings     Patient aware    All forms FX 12/12/18    FX [phone] ~ [place] [phone]    Thank you

## 2018-12-12 NOTE — Telephone Encounter
Reply by: Truman Hayward  Before I call patient, if she does the testing there can they  do the LAB's you have placed for her there too or we will   take care of those in our office on her return visit?    Please advise  Thank you

## 2018-12-16 ENCOUNTER — Telehealth: Payer: PRIVATE HEALTH INSURANCE

## 2018-12-16 NOTE — Telephone Encounter
Forwarded by: Aldona Lento  Records are in your box

## 2018-12-16 NOTE — Telephone Encounter
Called to review HER-2 is negative, patient endorsed understanding.

## 2018-12-16 NOTE — Telephone Encounter
Results Request - The patient would like to discuss the results of their recent tests.     1) Ordering MD: Lutricia Feil    2) What type of test(s)? Her2    3) When was it performed? 12/13/18    4) Where was it performed? [place]     If [place], are results available in [facility]? No  If outside facility, what is their phone number?     Patient has been notified of the 24-48 hour turnaround time.    Pt cbn: [phone]

## 2018-12-18 ENCOUNTER — Ambulatory Visit: Payer: PRIVATE HEALTH INSURANCE

## 2018-12-19 ENCOUNTER — Ambulatory Visit: Payer: PRIVATE HEALTH INSURANCE

## 2018-12-20 ENCOUNTER — Telehealth: Payer: PRIVATE HEALTH INSURANCE

## 2018-12-20 NOTE — Telephone Encounter
A signed and completed form has been sent to [facility] regarding genetic counseling, this was sent to [phone] at 10:40am and a copy was uploaded into [facility].

## 2018-12-24 ENCOUNTER — Telehealth: Payer: PRIVATE HEALTH INSURANCE

## 2018-12-24 ENCOUNTER — Ambulatory Visit: Payer: PRIVATE HEALTH INSURANCE

## 2018-12-24 NOTE — Telephone Encounter
PDL Call to Practice    Reason for Call: Ana with [facility] call to clarify lab orders as they are placed on hold   MD:  Dr. Lutricia Feil  Appointment Related?  []  Yes  [x]  No     If yes;  Date:  Time:    Call warm transferred to PDL: [x]  Yes  []  No    Call Received by Practice Representative:  Janett Billow

## 2018-12-24 NOTE — Telephone Encounter
Forwarded by: Onell Mcmath Natalie Stella Encarnacion

## 2018-12-24 NOTE — Telephone Encounter
Per Wilhemena Durie at [facility] she wanted to know what genetic counselor the patient needed to be sent to as that was the option that was marked by the provider on last weeks fax DOS 12/20/18.     I have left the original fax for the provider to note what cousselor he would like for patient, Wilhemena Durie was advised that provider was out today and would not have a response until tomorrow.

## 2018-12-25 ENCOUNTER — Telehealth: Payer: PRIVATE HEALTH INSURANCE

## 2018-12-25 ENCOUNTER — Ambulatory Visit: Payer: PRIVATE HEALTH INSURANCE

## 2018-12-25 ENCOUNTER — Telehealth: Payer: PRIVATE HEALTH INSURANCE | Attending: MS"

## 2018-12-25 NOTE — Telephone Encounter
Per Dr. Lutricia Feil his wish is to have the patient see [facility] Kateri Mc [facility] and [facility] provider. This information was faxed back to [place] at [facility] at 10:41am. I also spoke to [place] at [phone] and informed her of the providers decision.

## 2018-12-25 NOTE — Consults
CANCER GENETICS CONSULTATION - VIDEO VISIT    Reason for Referral:   1. Malignant neoplasm of right breast in female, estrogen receptor positive (HCC/RAF)     Referring Provider: Gloriann Loan., MD   Primary Care Provider: Melanie Crazier, MD   Accompanied By: n/a    Clinical History:    The patient is a 48 y.o. female who presents for genetic counseling due to a personal history of early onset breast cancer.    She was recently diagnosed with right breast cancer in May 2020 (age 19).  A biopsy confirmed a G2-3 IDC ER+/PR+/HER2-.  She met with Dr. Zoila Shutter on 12/12/2018 who recommended a surgical consultation and genetic testing.  He ordered [facility] on 12/13/2018.  Her insurance company requires genetic counseling for coverage so she presents today to discuss the testing and implications of results.    Today, her questions center on whether chemotherapy will be indicated after surgery and the impact of these results on her children.    Cancer Screening History:    We reviewed the status of recommended age-appropriate screenings.  ??? Mammogram: 11/2018 as noted above  ??? Colonoscopy: not indicated  ??? Pap smear: 10/2018  ??? Dermatology: many years ago    OB/GYN History:    G2P2  Pre-menopausal female   Menarche: 64  First live birth: 39  HRT: None reported  OCPs: short duration of use at an unknown age    Social History:    Marital Status: Married  Occupation: Field seismologist  Children: one daughter age 4 and one son age 12    Family History:    A complete, multigenerational pedigree was obtained.  It is summarized as follows:    ??? Maternal uncle diagnosed with lung cancer (+TOB) at age 33 and passed away at age 37.  ??? Paternal great grandmother, grandmother's mother, diagnosed with liver cancer at age 5 and passed away shortly after.    Consanguinity: None reported  Maternal ancestry: English  Paternal ancestry: Caucasian  Ashkenazi Jewish ancestry: None reported    Genetic Counseling and Risk Assessment: The patient's personal/family history raise some suspicion of an inherited predisposition of cancer.  We discussed these specific hereditary cancer syndromes in more detail given her personal/family history:  ??? BRCA1/BRCA2: For BRCA1/2 carriers the risk of breast cancer ranges from 30-50% by age 73 and 56-87% by age 81.  The risk of ovarian cancer ranges from 40-60% by age 36 for BRCA1 carriers and 10-27% by age 52 for BRCA2 carriers.  Studies also indicate that the risk of a second cancer is elevated for BRCA carriers who have been diagnosed with a primary breast cancer. The estimated lifetime risk for pancreatic cancer among BRCA mutation carriers regardless of gender is thought to be approximately 3-4% for BRCA1 carriers and up to 7% for BRCA2 carriers.  There is also an increased risk of malignant melanoma, female breast, and prostate cancers.     ??? We also reviewed other hereditary cancer genes with NCCN medical management guidelines as well as the availability of preliminary evidence genes.    We reviewed the NCCN guidelines and other management options she may need to consider including elevated screening for earlier detection, chemoprevention, and prophylactic surgery.   We agreed that we would review this information once test results are available and concrete recommendations can be made.  The concept of autosomal dominant and autosomal recessive inheritance was discussed with the patient.  The patient was informed that if she  is positive, the risk to her first-degree relatives is up to 50%, including her children and siblings.      Genetic Testing:    We discussed the potential positive, negative, and uncertain result, the interpretation of them, and how they relate to management recommendations.  We also discussed benefits and limitations of genetic testing, including the scope of GINA.  Her blood has already been drawn for [facility] testing. We reviewed the consent form and requisition form verbally.  The laboratory will perform an investigation of benefits and determine coverage, which was discussed with her.  We also reviewed the laboratory's billing policy.      Impression:    Jenna Newman is a 48 y.o. female with a personal/family history as described above:  ??? Patient meets NCCN criteria for hereditary predisposition testing as outlined above.  ??? Testing ins pending on [facility] testing, results returning in approximately 2 - 3 weeks from the time the laboratory receives the sample.  The patient asked to called with her results.    ??? We discussed implications of results including options of increased surveillance or preventative measures for management of breast cancer, identifying risks for other malignancies, and cascade testing for at-risk family members.  She states she is leaning towards a bilateral mastectomy but would like these results to confirm this decision.  She also has concerns about the risks to her children as their father's side of the family has an extensive history of cancer (paternal aunt dx breast age 87, paternal grandmother dx breast/pancreatic/liver ca).  We agreed to discuss the recommendations for them when her results are available to review.    A total of 30 minutes was spent with the patient with greater than 50% of the time devoted to counseling and coordination of care.    Vincent Gros, MS, [facility]  Genetic Counselor    Patient Consent to Telehealth Questionnaire   [facility] TELEHEALTH PRECHECKIN QUESTIONS 12/24/2018   By clicking ''I Agree'', I consent to the below:  I Agree     - I agree  to be treated via a video visit and acknowledge that I may be liable for any relevant copays or coinsurance depending on my insurance plan.  - I understand that this video visit is offered for my convenience and I am able to cancel and reschedule for an in-person appointment if I desire. - I also acknowledge that sensitive medical information may be discussed during this video visit appointment and that it is my responsibility to locate myself in a location that ensures privacy to my own level of comfort.  - I also acknowledge that I should not be participating in a video visit in a way that could cause danger to myself or to those around me (such as driving or walking).  If my provider is concerned about my safety, I understand that they have the right to terminate the visit.

## 2018-12-25 NOTE — Telephone Encounter
[facility] Genetic Counselor Kateri Mc is calling in regards to the [facility] testing pt had drawn in our office. She is asking that our office complete a form to add her name on the order so [facility] can process and release results to her. Danae Chen Silver is asking that this be done as soon as possible.

## 2018-12-30 ENCOUNTER — Ambulatory Visit: Payer: PRIVATE HEALTH INSURANCE

## 2019-01-02 ENCOUNTER — Ambulatory Visit: Payer: PRIVATE HEALTH INSURANCE | Attending: Hematology & Oncology

## 2019-01-02 DIAGNOSIS — Z170 Estrogen receptor positive status [ER+]: Secondary | ICD-10-CM

## 2019-01-02 DIAGNOSIS — C50911 Malignant neoplasm of unspecified site of right female breast: Secondary | ICD-10-CM

## 2019-01-02 NOTE — Progress Notes
Patient name:  Jenna Newman  MRN:  [id number]  DOB:  1971/07/08  CSN: [id number]  Date of encounter: 01/02/2019  Referring provider: No ref. provider found  PCP: Melanie Crazier, MD  Provider: Alric Seton. 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 Shanda Howells and genetics in consultation, in addition to breast MRI as noted below.     She is considering mastectomy with reconstruction vs lumpectomy. She has visit with plastic surgeon later this afternoon.     She is happy germline genetic testing was negative and that MRI did not reveal contralateral or multi focal/centric disease.    She has no new symptoms.     ONCOLOGY HISTORY:   11/27/18 b/l dx tomosynthesis mammo at [facility]: heterogeneously dense breast tissue. Distortion in the RUO breast peri-areolar region without suspicious calcifications.   11/27/18 RIGHT breast US: 15 x 16 x 22 mm mass at 11:00, 6 cm from the nipple, with a 1.4 cm extension of the mass off the superior aspect appearing continuous by a thin bridge of tissue. No frank right axillary adenopathy  12/06/18 US guided right breast bx: IDC, 8 mm, grade 2/3, SBR 7/9, ER/PR > 95%, Ki 67 25%, HER-2 FISH negative, with background grade 3 DCIS, cribriform pattern. No LCIS, no LVI, no microcalcifications  12/25/18 Genetics consultation: Myriad My Risk germline negative.   12/25/18 MRI breasts at [facility]: 2.8 x 2.1 x 2.2 cm mass at 11:00 axi right breast middle to anterior depth 5 cm from nipple, no e/o multi focal or multi centric or contralateral disease. No e/o nodal or metastatic dz seen.   12/31/18 Dr. Shanda Howells Surgical Oncology consult    ECOG performance status: 0    Objective:  Current medications: Outpatient Medications Prior to Visit   Medication Sig   ??? cetirizine 10 mg tablet Take 10 mg by mouth every other day.   ??? ALPRAZolam 0.5 mg tablet Take 1 tablet by mouth as needed for for Sleep.     No facility-administered medications prior to visit.        Allergies:  No Known Allergies    Review of Systems:  A 14 point review of systems was negative besides what I note in my HPI above.     Past Medical History:  Past Medical History:   Diagnosis Date   ??? Breast cancer (HCC/RAF)        Past Surgical History:  Past Surgical History:   Procedure Laterality Date   ??? BREAST FIBROADENOMA SURGERY Bilateral 1992   ??? CESAREAN SECTION  2006 & 2008   ??? CHOLECYSTECTOMY  2004   ??? KNEE SURGERY Left 2017    Ligament release    ??? TONSILECTOMY, ADENOIDECTOMY, BILATERAL MYRINGOTOMY AND TUBES  1983   ??? TUBAL LIGATION  2016       Social History:  Social History     Tobacco Use   ??? Smoking status: Never Smoker   ??? Smokeless tobacco: Never Used   Substance Use Topics   ??? Alcohol use: Not Currently     Family History:  No family history on file.    Vitals:  BP: 103/70 (06/04 0909)  Temp: 36.5 ???C (97.7 ???F) (06/04 4132)  Temp Source: Forehead (06/04  7829)  Heart Rate: 86 (06/04 0909)  Resp: 20 (06/04 0909)  SpO2: 97 % (06/04 0909)  Height: 160.3 cm (06/04 0909)  Weight: 61.3 kg (135 lb 3.2 oz) (06/04 0909)    Physical Exam:  General: Appears well-developed, well-nourished and close to stated age.   Head: Normocephalic, atraumatic.  Eyes: PERRL without icterus.   ENT: Oropharynx is clear, mucus membranes are moist.    CV: Regular in rate and rhythm, no murmurs or gallops.   Chest: Clear to auscultation bilaterally without wheezing or rhonchi.  Respiratory effort appears normal.   Abdomen: Soft, nontender and nondistended. Bowel sounds are present and normoactive. No organomegaly is appreciated  Musculoskeletal: No edema. No cyanosis. Extremities are warm and well-perfused.   Hematologic: No bruising, purpura or petechiae are noted. Dermatologic: No rashes appreciated.   Lymphatic: No palpable cervical, supraclavicular, axillary or inguinal adenopathy appreciated.   Psychiatric: Affect appropriate.  Pleasant and conversant  Breast: Not re-examined    Recent Labs:  Lab Results   Component Value Date    NA 141 12/13/2018    K 4.3 12/13/2018    CL 102 12/13/2018    CO2 24 12/13/2018    CREAT 0.78 12/13/2018    BUN 14 12/13/2018    GLUCOSE 63 (L) 12/13/2018    CALCIUM 9.7 12/13/2018     No results found for: WBC, HGB, HCT, MCV, PLT, NEUTPCT, NEUTABS, MONOPCT, MONOABS  Lab Results   Component Value Date    TOTPRO 7.6 12/13/2018    ALBUMIN 4.7 12/13/2018    BILITOT 0.4 12/13/2018    AST 19 12/13/2018    ALT 13 12/13/2018    ALKPHOS 46 12/13/2018     12/13/18: CEA = 0.8, [place] 27.29 = 27    Pertinent Imaging:  Per HPI     Pertinent Pathology:  Per HPI     Impression and Recommendations:  Jenna Newman is a 48 y.o. female with     1. Clinical Stage IIA cT2 (2.2 cm) cN0 cM0 RIGHT breast ductal cancer, ER/PR > 95%, HER-2 FISH negative, Ki 67 25%, grade 2/3, SBR 7/9, with background grade 3 DCIS, cribriform pattern. No LCIS, no LVI.  2. Myriad My Risk germline mutation analysis: negative    - discussed risks/benefits mastectomy vs breast conserving surgery followed by adjuvant radiation, including equivocal breast cancer outcomes  - discussed possibility of positive or close margin requiring re-excision  - patient will discuss surgical options further with Dr. Shanda Howells and plastics, as this is major cause of stress at this time  - reviewed absolute indication for endocrine treatment  - discussed likely need for Oncotype RS to provide prognostic and predictive information following surgery  - return visit after surgery to finalize adjuvant treatment plan  - patient understands decision regarding chemotherapy would need to be made prior to starting adjuvant radiation or endocrine treatment    Return to clinic: Return for see MD - patient will call to schedule follow up 2-3 weeks after surgery .    Orders:  No orders of the defined types were placed in this encounter.      Alric Seton. Zoila Shutter, MD  Assistant Professor of Medicine  Division of Hematology/Oncology  Phone: [phone]  Fax: [phone]  Email: [email or web address]    Attending Physician: Alric Seton. Zoila Shutter, MD.  Author: Alric Seton. Zoila Shutter, MD

## 2019-01-15 NOTE — Telephone Encounter
Return visit 02/10/19 @ 9AM    Patient aware  Thank you

## 2019-01-29 NOTE — Telephone Encounter
Per Donna's request, I called [facility] again, spoke to [facility] who advised Dr. Sheilah Mins is still working on the path report. Langley Gauss advised that she will not know the status of anything as far as sending out for Oncotype until tomorrow when the report is finalized.

## 2019-01-29 NOTE — Telephone Encounter
I've attempted to call [facility] twice, they may still be working part time hrs because of COVID. I will attempt to call them tomorrow.

## 2019-02-03 ENCOUNTER — Telehealth: Payer: PRIVATE HEALTH INSURANCE

## 2019-02-03 ENCOUNTER — Ambulatory Visit: Payer: PRIVATE HEALTH INSURANCE

## 2019-02-03 NOTE — Telephone Encounter
Case d/w Ms. Odeh -     Please get me the Oncotype order sheet - need to send this out from pathology at [facility] previously scheduled return visit - need to schedule return visit after we get the Oncotype back    Let me know if questions.

## 2019-02-03 NOTE — Telephone Encounter
I emailed her back about this too, please see communication.

## 2019-02-03 NOTE — Telephone Encounter
Forwarded by: Aldona Lento  Report is in your box.

## 2019-02-03 NOTE — Telephone Encounter
Called [facility] Path, no answer. I will try to again to call them in an hr.

## 2019-02-03 NOTE — Telephone Encounter
Results Request - The patient would like to discuss the results of their recent tests.     1) Ordering MD: Dr. Morrison Old     2) What type of test(s)? Surgery/ pathology    3) When was it performed? 06/29    4) Where was it performed? :[facility]    If [place], are results available in [facility]?   If outside facility, what is their phone number?     Patient has been notified of the 24-48 hour turnaround time.          [place]

## 2019-02-03 NOTE — Telephone Encounter
Forwarded by: Janace Aris [facility], no answer. I will attempt to call back in an hr.

## 2019-02-03 NOTE — Telephone Encounter
Good Afternoon,    Call Back Request    MD:  Dr. Lutricia Feil    Reason for call back: patient called in to follow-up on the note below. She advised that she followed-up with [facility] and they had faxed over the report about an hour ago.    Any Symptoms:  []  Yes  [x]  No      ? If yes, what symptoms are you experiencing:    o Duration of symptoms (how long):    o Have you taken medication for symptoms (OTC or Rx):      Patient or caller has been notified of the 24-48 hour turnaround time.  CBN: [phone]

## 2019-02-03 NOTE — Telephone Encounter
Called pt, LM informing her the path report is ready to pick up.

## 2019-02-04 NOTE — Telephone Encounter
Reply by: Edwin Cap   Return visit canceled

## 2019-02-04 NOTE — Telephone Encounter
Forwarded by: Berlie Persky Monique Shakemia [place]

## 2019-02-04 NOTE — Telephone Encounter
Oncotype submitted online via [employer] portal  02/04/19  1635

## 2019-02-07 NOTE — Telephone Encounter
Please advise when oncotype results are in to book appt for patient.  Thank you

## 2019-02-07 NOTE — Telephone Encounter
It will take anywhere from 14-17 days to have resutls.

## 2019-02-10 ENCOUNTER — Ambulatory Visit: Payer: PRIVATE HEALTH INSURANCE | Attending: Hematology & Oncology

## 2019-02-13 ENCOUNTER — Telehealth: Payer: PRIVATE HEALTH INSURANCE

## 2019-02-13 ENCOUNTER — Telehealth: Payer: PRIVATE HEALTH INSURANCE | Attending: Hematology & Oncology

## 2019-02-13 MED ORDER — TAMOXIFEN CITRATE 20 MG PO TABS
20 mg | ORAL_TABLET | Freq: Every day | ORAL | 11 refills | 60.00000 days | Status: AC
Start: 2019-02-13 — End: ?

## 2019-02-13 NOTE — Progress Notes
Patient Consent to Telehealth Questionnaire   [facility] TELEHEALTH PRECHECKIN QUESTIONS 02/13/2019   By clicking ''I Agree'', I consent to the below:  I Agree     - I agree  to be treated via a video visit and acknowledge that I may be liable for any relevant copays or coinsurance depending on my insurance plan.  - I understand that this video visit is offered for my convenience and I am able to cancel and reschedule for an in-person appointment if I desire.  - I also acknowledge that sensitive medical information may be discussed during this video visit appointment and that it is my responsibility to locate myself in a location that ensures privacy to my own level of comfort.  - I also acknowledge that I should not be participating in a video visit in a way that could cause danger to myself or to those around me (such as driving or walking).  If my provider is concerned about my safety, I understand that they have the right to terminate the visit.         Patient name:  Jenna Newman  MRN:  [id number]  DOB:  [birth year]  CSN: [id number]  Date of encounter: 02/13/2019  Referring provider: No ref. provider found  PCP: Melanie Crazier, MD  Provider: Alric Seton. 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 Shanda Howells, which, as noted below, revealed a 3.6 cm grade 2 IDC with 1 of 2 positive sentinel nodes measuring 8 mm.     The RS was 16, indicating a distant recurrence risk at 9 years of 15% following 5 years of endocrine therapy in the sub-group with 1-3 positive nodes.     The patient has a lot of anxiety surrounding her illness. She denies prior anxiety or depression. She does not currently feel like she needs to speak with anyone about this. She has plans for re-excision with Dr. Shanda Howells next Monday. She does not want chemotherapy after extensive research on the internet although she understands the benefit for adjuvant chemotherapy in those < 35 years of age with RS between 16-20. She is worried about both acute and late term toxicities. She wants to work on diet/exercise and agrees to minimum of 5 years endocrine treatment.     She requests staging PET/CT, fearing she has metastatic disease. She denies headaches, vision changes, anorexia, rapid weight loss, nausea, vomiting, fevers, sweats, chills, cough, chest pain or bone pain.     ONCOLOGY HISTORY:   11/27/18 b/l dx tomosynthesis mammo at [facility]: heterogeneously dense breast tissue. Distortion in the RUO breast peri-areolar region without suspicious calcifications.   11/27/18 RIGHT breast US: 15 x 16 x 22 mm mass at 11:00, 6 cm from the nipple, with a 1.4 cm extension of the mass off the superior aspect appearing continuous by a thin bridge of tissue. No frank right axillary adenopathy  12/06/18 US guided right breast bx: IDC, 8 mm, grade 2/3, SBR 7/9, ER/PR > 95%, Ki 67 25%, HER-2 FISH negative, with background grade 3 DCIS, cribriform pattern. No LCIS, no LVI, no microcalcifications  12/25/18 Genetics consultation: Myriad My Risk germline negative.   12/25/18 MRI breasts at [facility]:  2.8 x 2.1 x 2.2 cm mass at 11:00 axi right breast middle to anterior depth 5 cm from nipple, no e/o multi focal or multi centric or contralateral disease. No e/o nodal or metastatic dz seen.   12/31/18 Dr. Shanda Howells Surgical Oncology consult  01/27/19 RIGHT lumpectomy/SLN bx and LEFT breast reduction mammoplasty:   RIGHT: 3.6 cm IDC, grade 2/3, MBR 6/9, +LVI, no PNI, with 1 of 2 sentinel nodes positive for 8 mm metastatic cancer, with 3.5 cm DCIS, intermediate grade, comedo-type, cribriform and solid, with focally + CIS margins at superior/medial, invasive carcinoma margins uninvolved  LEFT: benign, no in situ or invasive dz.     ECOG performance status: 0    Objective:  Current medications:  Outpatient Medications Prior to Visit   Medication Sig   ??? ALPRAZolam 0.5 mg tablet Take 1 tablet by mouth as needed for for Sleep.   ??? cetirizine 10 mg tablet Take 10 mg by mouth every other Newman.     No facility-administered medications prior to visit.        Allergies:  No Known Allergies    Review of Systems:  A 14 point review of systems was negative besides what I note in my HPI above.     Past Medical History:  Past Medical History:   Diagnosis Date   ??? Breast cancer (HCC/RAF)        Past Surgical History:  Past Surgical History:   Procedure Laterality Date   ??? BREAST FIBROADENOMA SURGERY Bilateral 1992   ??? CESAREAN SECTION  2006 & 2008   ??? CHOLECYSTECTOMY  2004   ??? KNEE SURGERY Left 2017    Ligament release    ??? TONSILECTOMY, ADENOIDECTOMY, BILATERAL MYRINGOTOMY AND TUBES  1983   ??? TUBAL LIGATION  2016       Social History:  Social History     Tobacco Use   ??? Smoking status: Never Smoker   ??? Smokeless tobacco: Never Used   Substance Use Topics   ??? Alcohol use: Not Currently     Family History:  No family history on file.     Telemedicine visit, last exam left for historical purposes     Vitals:  BP: --  Temp: --  Temp Source: --  Heart Rate: --  Resp: --  SpO2: --  Height: --  Weight: --    Physical Exam:  General: Appears well-developed, well-nourished and close to stated age.   Head: Normocephalic, atraumatic.  Eyes: PERRL without icterus.   ENT: Oropharynx is clear, mucus membranes are moist.    CV: Regular in rate and rhythm, no murmurs or gallops.   Chest: Clear to auscultation bilaterally without wheezing or rhonchi.  Respiratory effort appears normal.   Abdomen: Soft, nontender and nondistended. Bowel sounds are present and normoactive. No organomegaly is appreciated Musculoskeletal: No edema. No cyanosis. Extremities are warm and well-perfused.   Hematologic: No bruising, purpura or petechiae are noted.   Dermatologic: No rashes appreciated.   Lymphatic: No palpable cervical, supraclavicular, axillary or inguinal adenopathy appreciated.   Psychiatric: Affect appropriate.  Pleasant and conversant  Breast: Not re-examined    Recent Labs:  Lab Results   Component Value Date    NA 141 12/13/2018    K 4.3 12/13/2018    CL 102 12/13/2018    CO2 24 12/13/2018    CREAT 0.78 12/13/2018    BUN 14 12/13/2018    GLUCOSE 63 (L) 12/13/2018    CALCIUM 9.7 12/13/2018  No results found for: WBC, HGB, HCT, MCV, PLT, NEUTPCT, NEUTABS, MONOPCT, MONOABS  Lab Results   Component Value Date    TOTPRO 7.6 12/13/2018    ALBUMIN 4.7 12/13/2018    BILITOT 0.4 12/13/2018    AST 19 12/13/2018    ALT 13 12/13/2018    ALKPHOS 46 12/13/2018     12/13/18: CEA = 0.8, [place] 27.29 = 27    Pertinent Imaging:  Per HPI     Pertinent Pathology:  Per HPI     Impression and Recommendations:  RAINNA NEARHOOD is a 48 y.o. female with     1. Stage IIB pT2 (3.6 cm) cN1a (1/2 - 8 mm) cM0 RIGHT breast ICD, ER/PR > 95%, HER-2 FISH negative, Ki 67 25%, grade 2/3, SBR 7/9, +LVI, -PNI, taken to clear margins June 2020 via lumpectomy, with background intermediate to high-grade 3 DCIS.  Focally positive CIS margins, awaiting re-excision next Monday. Recurrence Score = 16.   2. Anxiety surrounding health  3. Myriad My Risk germline mutation analysis: negative    - reviewed oncotype recurrence score results, speaking to both the prognostic and predictive data associated with this specific result  - reviewed TAILORx exploratory subgroup analysis speaking to the statistically significant chemotherapy benefit in those < 50 with recurrence score of 16-25 (of note, scores of 16-20 had 1.6% benefit, while those with scores of 21-25 had 6.5% benefit)  - as noted above, and after thorough discussion of risks/benefits and goals/values of care, the patient has opted against adjuvant cytotoxic chemotherapy, and will proceed with endocrine therapy alone  - reviewed role/rationale/endocrine alternatives to tamoxifen vs OS/AI, etc  - referral to radiation oncology  - tamoxifen sent to pharmacy, patient will likely start after radiation  - recommend BCI moving forward pending tolerance to adjuvant endocrine treatment in order to understand possible benefit of extended endocrine treatment  - reviewed importance of diet/exercise in relationship to breast cancer specific outcomes  - PET/CT ordered per patient request (patient worried about risk of distant disease given sentinel node positivity, feels that negative PET/CT would improve overall well being, most notable, psychological) --> discussed possible false positive findings that would result in recommended biopsy  - video visit after PET/CT    Return to clinic:  No follow-ups on file.    Orders:  Orders Placed This Encounter   ??? PETCT with Attenuation Correction CT (ACCT) only; FDG   ??? Referral to Radiation Oncology   ??? tamoxifen 20 mg tablet       Alric Seton. Zoila Shutter, MD  Assistant Professor of Medicine  Division of Hematology/Oncology  Phone: [phone]  Fax: [phone]  Email: [email or web address]    Attending Physician: Alric Seton. Zoila Shutter, MD.  Author: Alric Seton. Zoila Shutter, MD

## 2019-02-13 NOTE — Telephone Encounter
PET-CT at [facility] @ 02/27/19 / 10:15AM    Patient aware    Scheduled with Angie    All forms FX 02/13/19    FX [phone] ~ [place] [phone]    Thank you

## 2019-02-13 NOTE — Telephone Encounter
Okay to schedule. Pt results have come in.   I have printed them and will place in your box

## 2019-02-13 NOTE — Telephone Encounter
Dr. Arvil Persons RAD [place] 02/26/19 @ 10:30AM    Patient aware    Scheduled with Daisy    All forms FX 02/13/19    FX [phone] ~ [place] [phone]    Thank you

## 2019-02-13 NOTE — Telephone Encounter
Scheduled for today.

## 2019-02-14 ENCOUNTER — Ambulatory Visit: Payer: PRIVATE HEALTH INSURANCE | Attending: Hematology & Oncology

## 2019-02-18 ENCOUNTER — Telehealth: Payer: PRIVATE HEALTH INSURANCE

## 2019-02-18 NOTE — Telephone Encounter
Forwarded by: Isidore Moos,  Did we receive any notification from the Imaging facility regarding the denial?

## 2019-02-18 NOTE — Telephone Encounter
Spoke to [facility] requesting mammos and any chest scans for pt. Jenna Newman advised she will fax the records to [phone]

## 2019-02-18 NOTE — Telephone Encounter
Reply by: Aldona Lento  Spoke to [facility] @ [facility] advised they have not received any information regarding a pending denial. Viviana advised she just uploaded the records they had for the pt and had also just checked the site this morning and all the site specified was; awaiting clinical information. Viviana informed me she will call [facility] to see whats going/what else is needed.

## 2019-02-18 NOTE — Telephone Encounter
[place] and was told pt had their 18 and 2016 mammos at [facility].

## 2019-02-18 NOTE — Telephone Encounter
Reply by: Truman Hayward  Sounds good Janett Billow.  Thank you

## 2019-02-18 NOTE — Telephone Encounter
Received the records from [facility] and faxed them to [facility] @ [place] @ [phone].

## 2019-02-18 NOTE — Telephone Encounter
Reply by: Truman Hayward  I have not heard anything.   Thank you Janett Billow

## 2019-02-18 NOTE — Telephone Encounter
Copied from [place] [phone]. Topic: [facility] Message  >> Feb 18, 2019  9:11 AM Fatima Sanger wrote:  Call Back Request    MD:  Lutricia Feil     Reason for call back:  Kristine Royal from [employer] on behalf of Christella Scheuermann calling in regards to pt's PET scan which is pending denial. Please call back.    Cbn: [phone]   Reference # [id number]    Thank You     Any Symptoms:  []  Yes  [x]  No      ? If yes, what symptoms are you experiencing:    o Duration of symptoms (how long):    o Have you taken medication for symptoms (OTC or Rx):      Patient or caller has been notified of the 24-48 hour turnaround time.

## 2019-02-18 NOTE — Telephone Encounter
Received a call from [facility] stating [facility] is needing additional clinicals on top of what was already sent to them. I informed Fredrich Romans I will check to see if I can obtain any other scans from [place]

## 2019-02-19 ENCOUNTER — Telehealth: Payer: PRIVATE HEALTH INSURANCE

## 2019-02-19 NOTE — Telephone Encounter
Message to Practice/Provider    MD: Lutricia Feil     Message: Duwaine Maxin is an Onoclogy RN from [facility] requesting to be faxed the clinical notes from appointment on 02/13/2019.    Fax#:[phone]  CB#: [phone]       Return call is not being requested by the patient or caller.    Patient or caller has been notified of the 24-48 hour processing turnaround time if applicable.

## 2019-02-19 NOTE — Telephone Encounter
Reply by: Aldona Lento  Done. Faxed notes from 07/16 to; [phone]

## 2019-02-20 ENCOUNTER — Ambulatory Visit: Payer: PRIVATE HEALTH INSURANCE

## 2019-02-20 NOTE — Telephone Encounter
Reply by: Aldona Lento  Spoke to [place] @ [place] who confirmed scan was denied and we can call [facility] to have Dr. Lutricia Feil request a review (peer to peer [phone]); PET is scheduled 07/30. Dr please advise if you would like for me to arrange the peer to peer.

## 2019-02-20 NOTE — Telephone Encounter
Good morning Jenna Newman. Jenna Newman has faxed forms to inform PET-CT  is not covered. Forms have been scanned into [employer]. I do not know  If P2P is an option. I will attach Dr. Lutricia Feil in case you need   to set one up.    Thank you

## 2019-02-20 NOTE — Telephone Encounter
Path is in your box.

## 2019-02-21 ENCOUNTER — Ambulatory Visit: Payer: PRIVATE HEALTH INSURANCE

## 2019-02-21 NOTE — Telephone Encounter
Reply by: Lavanda Nevels Segura Imojean Yoshino  Thank you

## 2019-02-21 NOTE — Telephone Encounter
Insurance denied PET    I have emailed patient to see if she wants me to place CT scan orders.

## 2019-02-21 NOTE — Telephone Encounter
Forwarded by: Kris Hartmann  Hello Ladies,   We are expecting a PTP call at 12:15pm

## 2019-02-21 NOTE — Telephone Encounter
Please try and schedule peer to peer today during lunch hour if possible.

## 2019-02-21 NOTE — Telephone Encounter
Reply by: Kris Hartmann  Hello Dr. Lutricia Feil   I spoke with Candice B @ [facility] The PTP is scheduled today at 1215pm with Dr. Griffin Basil.   Case # [id number]  Thank you

## 2019-02-21 NOTE — Telephone Encounter
Forwarded by: Arafat Cocuzza Monique Iyanni Hepp

## 2019-02-24 ENCOUNTER — Ambulatory Visit: Payer: PRIVATE HEALTH INSURANCE

## 2019-02-24 NOTE — Telephone Encounter
I have messaged patient advising Dr. Lutricia Feil is out this  whole week.    Thank you

## 2019-02-24 NOTE — Telephone Encounter
Please let patient know that we have primary care physicians here, but that Dr. Lutricia Feil is out until next week

## 2019-02-26 ENCOUNTER — Telehealth: Payer: PRIVATE HEALTH INSURANCE

## 2019-02-26 NOTE — Telephone Encounter
Call Back Request    MD: Dr. Lutricia Feil       Reason for call back: Patient would like to come by this afternoon to pick up a copy of her 02/17/19 pathology report. Please contact patient at your earliest convenience to confirm its ok.     Any Symptoms:  []  Yes  [x]  No      ? If yes, what symptoms are you experiencing:    o Duration of symptoms (how long):    o Have you taken medication for symptoms (OTC or Rx):      Patient or caller has been notified of the 24-48 hour turnaround time.

## 2019-02-26 NOTE — Telephone Encounter
Reply by: Edwin Cap   Advised PT can come by at any time to pick it up.   Will be at front desk

## 2019-03-03 ENCOUNTER — Telehealth: Payer: PRIVATE HEALTH INSURANCE | Attending: Hematology & Oncology

## 2019-03-03 DIAGNOSIS — C50911 Malignant neoplasm of unspecified site of right female breast: Secondary | ICD-10-CM

## 2019-03-03 DIAGNOSIS — Z170 Estrogen receptor positive status [ER+]: Secondary | ICD-10-CM

## 2019-03-03 NOTE — Telephone Encounter
Forwarded by: Aldona Lento  Hello Dr,  Any update regarding the scan?

## 2019-03-03 NOTE — Telephone Encounter
Will d/w patient during appt today and let everyone know. thanks

## 2019-03-03 NOTE — Progress Notes
Patient Consent to Telehealth Questionnaire   [facility] TELEHEALTH PRECHECKIN QUESTIONS 03/03/2019   By clicking ''I Agree'', I consent to the below:  I Agree     - I agree  to be treated via a video visit and acknowledge that I may be liable for any relevant copays or coinsurance depending on my insurance plan.  - I understand that this video visit is offered for my convenience and I am able to cancel and reschedule for an in-person appointment if I desire.  - I also acknowledge that sensitive medical information may be discussed during this video visit appointment and that it is my responsibility to locate myself in a location that ensures privacy to my own level of comfort.  - I also acknowledge that I should not be participating in a video visit in a way that could cause danger to myself or to those around me (such as driving or walking).  If my provider is concerned about my safety, I understand that they have the right to terminate the visit.       Patient name:  Jenna Newman  MRN:  [id number]  DOB:  1971-02-08  CSN: [id number]  Date of encounter: 03/03/2019  Referring provider: No ref. provider found  PCP: Melanie Crazier, MD  Provider: Alric Seton. 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 Shanda Howells, which, as noted below, revealed a 3.6 cm grade 2 IDC with 1 of 2 positive sentinel nodes measuring 8 mm.     The RS was 16, indicating a distant recurrence risk at 9 years of 15% following 5 years of endocrine therapy in the sub-group with 1-3 positive nodes.     The patient has a lot of anxiety surrounding her illness. She denies prior anxiety or depression. She does not currently feel like she needs to speak with anyone about this. She has plans for re-excision with Dr. Shanda Howells next Monday. She does not want chemotherapy after extensive research on the internet although she understands the benefit for adjuvant chemotherapy in those < 45 years of age with RS between 16-20. She is worried about both acute and late term toxicities. She wants to work on diet/exercise and agrees to minimum of 5 years endocrine treatment.     She requests staging PET/CT, fearing she has metastatic disease. She denies headaches, vision changes, anorexia, rapid weight loss, nausea, vomiting, fevers, sweats, chills, cough, chest pain or bone pain.     ONCOLOGY HISTORY:   11/27/18 b/l dx tomosynthesis mammo at [facility]: heterogeneously dense breast tissue. Distortion in the RUO breast peri-areolar region without suspicious calcifications.   11/27/18 RIGHT breast US: 15 x 16 x 22 mm mass at 11:00, 6 cm from the nipple, with a 1.4 cm extension of the mass off the superior aspect appearing continuous by a thin bridge of tissue. No frank right axillary adenopathy  12/06/18 US guided right breast bx: IDC, 8 mm, grade 2/3, SBR 7/9, ER/PR > 95%, Ki 67 25%, HER-2 FISH negative, with background grade 3 DCIS, cribriform pattern. No LCIS, no LVI, no microcalcifications  12/25/18 Genetics consultation: Myriad My Risk germline negative.   12/25/18 MRI breasts at [facility]: 2.8 x  2.1 x 2.2 cm mass at 11:00 axi right breast middle to anterior depth 5 cm from nipple, no e/o multi focal or multi centric or contralateral disease. No e/o nodal or metastatic dz seen.   12/31/18 Dr. Shanda Howells Surgical Oncology consult  01/27/19 RIGHT lumpectomy/SLN bx and LEFT breast reduction mammoplasty:   RIGHT: 3.6 cm IDC, grade 2/3, MBR 6/9, +LVI, no PNI, with 1 of 2 sentinel nodes positive for 8 mm metastatic cancer, with 3.5 cm DCIS, intermediate grade, comedo-type, cribriform and solid, with focally + CIS margins at superior/medial, invasive carcinoma margins uninvolved  LEFT: benign, no in situ or invasive dz.     ECOG performance status: 0    Objective:  Current medications:  Outpatient Medications Prior to Visit   Medication Sig   ??? ALPRAZolam 0.5 mg tablet Take 1 tablet by mouth as needed for for Sleep.   ??? cetirizine 10 mg tablet Take 10 mg by mouth every other day.   ??? tamoxifen 20 mg tablet Take 1 tablet (20 mg total) by mouth daily.     No facility-administered medications prior to visit.        Allergies:  No Known Allergies    Review of Systems:  A 14 point review of systems was negative besides what I note in my HPI above.     Past Medical History:  Past Medical History:   Diagnosis Date   ??? Breast cancer (HCC/RAF)        Past Surgical History:  Past Surgical History:   Procedure Laterality Date   ??? BREAST FIBROADENOMA SURGERY Bilateral 1992   ??? CESAREAN SECTION  2006 & 2008   ??? CHOLECYSTECTOMY  2004   ??? KNEE SURGERY Left 2017    Ligament release    ??? TONSILECTOMY, ADENOIDECTOMY, BILATERAL MYRINGOTOMY AND TUBES  1983   ??? TUBAL LIGATION  2016       Social History:  Social History     Tobacco Use   ??? Smoking status: Never Smoker   ??? Smokeless tobacco: Never Used   Substance Use Topics   ??? Alcohol use: Not Currently     Family History:  No family history on file.     Telemedicine visit, last exam left for historical purposes     Vitals:  BP: --  Temp: --  Temp Source: --  Heart Rate: --  Resp: --  SpO2: --  Height: --  Weight: --    Physical Exam:  General: Appears well-developed, well-nourished and close to stated age.   Head: Normocephalic, atraumatic.  Eyes: PERRL without icterus.   ENT: Oropharynx is clear, mucus membranes are moist.    CV: Regular in rate and rhythm, no murmurs or gallops.   Chest: Clear to auscultation bilaterally without wheezing or rhonchi.  Respiratory effort appears normal. Abdomen: Soft, nontender and nondistended. Bowel sounds are present and normoactive. No organomegaly is appreciated  Musculoskeletal: No edema. No cyanosis. Extremities are warm and well-perfused.   Hematologic: No bruising, purpura or petechiae are noted.   Dermatologic: No rashes appreciated.   Lymphatic: No palpable cervical, supraclavicular, axillary or inguinal adenopathy appreciated.   Psychiatric: Affect appropriate.  Pleasant and conversant  Breast: Not re-examined    Recent Labs:  Lab Results   Component Value Date    NA 141 12/13/2018    K 4.3 12/13/2018    CL 102 12/13/2018    CO2 24 12/13/2018    CREAT 0.78 12/13/2018    BUN 14 12/13/2018  GLUCOSE 63 (L) 12/13/2018    CALCIUM 9.7 12/13/2018     No results found for: WBC, HGB, HCT, MCV, PLT, NEUTPCT, NEUTABS, MONOPCT, MONOABS  Lab Results   Component Value Date    TOTPRO 7.6 12/13/2018    ALBUMIN 4.7 12/13/2018    BILITOT 0.4 12/13/2018    AST 19 12/13/2018    ALT 13 12/13/2018    ALKPHOS 46 12/13/2018     12/13/18: CEA = 0.8, Jack 27.29 = 27    Pertinent Imaging:  Per HPI     Pertinent Pathology:  Per HPI     Impression and Recommendations:  RAYGAN SKARDA is a 48 y.o. female with     1. Stage IIB pT2 (3.6 cm) cN1a (1/2 - 8 mm) cM0 RIGHT breast ICD, ER/PR > 95%, HER-2 FISH negative, Ki 67 25%, grade 2/3, SBR 7/9, +LVI, -PNI, taken to clear margins June 2020 via lumpectomy, with background intermediate to high-grade 3 DCIS.  Focally positive CIS margins, awaiting re-excision next Monday. Recurrence Score = 16.   2. Anxiety surrounding health  3. Myriad My Risk germline mutation analysis: negative    - reviewed oncotype recurrence score results, speaking to both the prognostic and predictive data associated with this specific result  - reviewed TAILORx exploratory subgroup analysis speaking to the statistically significant chemotherapy benefit in those < 50 with recurrence score of 16-25 (of note, scores of 16-20 had 1.6% benefit, while those with scores of 21-25 had 6.5% benefit)  - as noted above, and after thorough discussion of risks/benefits and goals/values of care, the patient has opted against adjuvant cytotoxic chemotherapy, and will proceed with endocrine therapy alone  - reviewed role/rationale/endocrine alternatives to tamoxifen vs OS/AI, etc  - referral to radiation oncology  - tamoxifen sent to pharmacy, patient will likely start after radiation  - recommend BCI moving forward pending tolerance to adjuvant endocrine treatment in order to understand possible benefit of extended endocrine treatment  - reviewed importance of diet/exercise in relationship to breast cancer specific outcomes  - PET/CT ordered per patient request (patient worried about risk of distant disease given sentinel node positivity, feels that negative PET/CT would improve overall well being, most notable, psychological) --> discussed possible false positive findings that would result in recommended biopsy  - video visit after PET/CT    Return to clinic:  No follow-ups on file.    Orders:  No orders of the defined types were placed in this encounter.      Alric Seton. Zoila Shutter, MD  Assistant Professor of Medicine  Division of Hematology/Oncology  Phone: [phone]  Fax: [phone]  Email: [email or web address]    Attending Physician: Alric Seton. Zoila Shutter, MD.  Author: Alric Seton. Zoila Shutter, MD

## 2019-03-19 ENCOUNTER — Telehealth: Payer: PRIVATE HEALTH INSURANCE

## 2019-03-19 NOTE — Telephone Encounter
Let me know when you receive the pathology report

## 2019-03-19 NOTE — Telephone Encounter
Results Request - The patient would like to discuss the results of their recent tests.     1) Ordering MD: Dr. Lutricia Feil    2) What type of test(s)? Pathology    3) When was it performed? 08/17    4) Where was it performed? [place]    If [place], are results available in [facility]?   If outside facility, what is their phone number?     Patient has been notified of the 24-48 hour turnaround time.        CBN: [phone]

## 2019-03-19 NOTE — Telephone Encounter
Forwarded by: Livingston Denner Monique Kanisha Duba

## 2019-03-21 NOTE — Telephone Encounter
Called patient back reviewed path, keeping return visit with me next week

## 2019-03-21 NOTE — Telephone Encounter
Reply by: Kris Hartmann  I will fax [facility] a medical release to obtain this report  Thank you

## 2019-03-21 NOTE — Telephone Encounter
Forwarded by: Kris Hartmann  Path report in your box

## 2019-03-24 NOTE — Telephone Encounter
Please advise doctor.  Thank you

## 2019-03-25 ENCOUNTER — Ambulatory Visit: Payer: PRIVATE HEALTH INSURANCE | Attending: Hematology & Oncology

## 2019-03-25 DIAGNOSIS — C773 Secondary and unspecified malignant neoplasm of axilla and upper limb lymph nodes: Secondary | ICD-10-CM

## 2019-03-25 DIAGNOSIS — C50911 Malignant neoplasm of unspecified site of right female breast: Secondary | ICD-10-CM

## 2019-03-25 DIAGNOSIS — Z170 Estrogen receptor positive status [ER+]: Secondary | ICD-10-CM

## 2019-03-25 MED ORDER — ONDANSETRON HCL 8 MG PO TABS
8 mg | ORAL_TABLET | Freq: Three times a day (TID) | ORAL | 3 refills | 8.00000 days | Status: AC | PRN
Start: 2019-03-25 — End: ?

## 2019-03-25 MED ORDER — LORAZEPAM 1 MG PO TABS
1 mg | ORAL_TABLET | Freq: Four times a day (QID) | ORAL | 0 refills | Status: AC | PRN
Start: 2019-03-25 — End: ?

## 2019-03-25 MED ORDER — DEXAMETHASONE 4 MG PO TABS
8 mg | ORAL_TABLET | Freq: Two times a day (BID) | ORAL | 3 refills | Status: AC
Start: 2019-03-25 — End: ?

## 2019-03-25 NOTE — Progress Notes
Patient name:  Jenna Newman  MRN:  [id number]  DOB:  [birth year]  CSN: [id number]  Date of encounter: 03/25/2019  Referring provider: No ref. provider found  PCP: Melanie Crazier, MD  Provider: Alric Seton. 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    Her completion mastectomy earlier this month revealed residual DCIS, margins were cleared, closest was 6 mm. 8 additional lymph nodes were resected, all negative for disease.     She is here with her husband for additional questions regarding pros, cons, and side effect profiles of specific adjuvant chemotherapy regimens vs endocrine adjuvant systemic therapy alone.     She has anxiety related to her diagnosis, is using valium prn at home which helps. States she has no prior history of anxiety or depression.     No headaches, vision changes, anorexia, rapid weight loss, nausea, vomiting, fevers, sweats, chills, cough, chest pain or bone pain.     ONCOLOGY HISTORY:   11/27/18 b/l dx tomosynthesis mammo at [facility]: heterogeneously dense breast tissue. Distortion in the RUO breast peri-areolar region without suspicious calcifications.   11/27/18 RIGHT breast US: 15 x 16 x 22 mm mass at 11:00, 6 cm from the nipple, with a 1.4 cm extension of the mass off the superior aspect appearing continuous by a thin bridge of tissue. No frank right axillary adenopathy  12/06/18 US guided right breast bx: IDC, 8 mm, grade 2/3, SBR 7/9, ER/PR > 95%, Ki 67 25%, HER-2 FISH negative, with background grade 3 DCIS, cribriform pattern. No LCIS, no LVI, no microcalcifications  12/25/18 Genetics consultation: Myriad My Risk germline negative.   12/25/18 MRI breasts at [facility]: 2.8 x 2.1 x 2.2 cm mass at 11:00 axi right breast middle to anterior depth 5 cm from nipple, no e/o multi focal or multi centric or contralateral disease. No e/o nodal or metastatic dz seen.   12/31/18 Dr. Shanda Howells Surgical Oncology consult  01/27/19 RIGHT lumpectomy/SLN bx and LEFT breast reduction mammoplasty:   RIGHT: 3.6 cm IDC, grade 2/3, MBR 6/9, +LVI, no PNI, with 1 of 2 sentinel nodes positive for 8 mm metastatic cancer, with 3.5 cm DCIS, intermediate grade, comedo-type, cribriform and solid, with focally + CIS margins at superior/medial, invasive carcinoma margins uninvolved  LEFT: benign, no in situ or invasive dz.   02/17/19 RIGHT breast re-excision: UDH, apocrine metaplasia, no residual invasive carcinoma, focal residual DCIS measuring 16 mm, 0.2 mm from new lateral margin.   03/17/19 RIGHT nipple sparing mastectomy and level 1/2 ax node dissection with reconstruction (Dr. Renaldo Reel): focal, residual 3 mm DCIS, 6 mm from closest margin, no residual invasive cancer, ADH free of margins by 1.3 cm, 8 axillary nodes removed, all negative.     ECOG performance status: 0    Objective:  Current medications:  Outpatient Medications Prior to Visit   Medication Sig   ??? cetirizine 10 mg tablet Take 10 mg by mouth every other day.   ??? tamoxifen 20 mg tablet Take 1 tablet (20 mg total) by mouth daily.   ??? ALPRAZolam 0.5 mg tablet Take 1 tablet by mouth  as needed for for Sleep.   ??? diazePAM 5 mg tablet TAKE ONE TABLET BY MOUTH EVERY SIX HOURS AS NEEDED FOR MUSCLE SPASMS     No facility-administered medications prior to visit.        Allergies:  No Known Allergies    Review of Systems:  A 14 point review of systems was negative besides what I note in my HPI above.     Past Medical History:  Past Medical History:   Diagnosis Date   ??? Breast cancer (HCC/RAF) Past Surgical History:  Past Surgical History:   Procedure Laterality Date   ??? BREAST FIBROADENOMA SURGERY Bilateral 1992   ??? BREAST LUMPECTOMY Right 12/2018   ??? CESAREAN SECTION  2006 & 2008   ??? CHOLECYSTECTOMY  2004   ??? KNEE SURGERY Left 2017    Ligament release    ??? MASTECTOMY Right 03/2019   ??? TONSILECTOMY, ADENOIDECTOMY, BILATERAL MYRINGOTOMY AND TUBES  1983   ??? TUBAL LIGATION  2016       Social History:  Social History     Tobacco Use   ??? Smoking status: Never Smoker   ??? Smokeless tobacco: Never Used   Substance Use Topics   ??? Alcohol use: Not Currently     Family History:  No family history on file.     Vitals:  BP: 99/61 (08/25 1127)  Temp: 36.8 ???C (98.2 ???F) (08/25 1127)  Temp Source: Forehead (08/25 1127)  Heart Rate: 74 (08/25 1127)  Resp: 20 (08/25 1127)  SpO2: 98 % (08/25 1127)  Height: 159.9 cm (08/25 1127)  Weight: 61.9 kg (136 lb 6.4 oz) (08/25 1127)    Physical Exam:  General: Appears well-developed, well-nourished and close to stated age.   Head: Normocephalic, atraumatic.  Eyes: PERRL without icterus.   ENT: Oropharynx is clear, mucus membranes are moist.    CV: Regular in rate and rhythm, no murmurs or gallops.   Chest: Clear to auscultation bilaterally without wheezing or rhonchi.  Respiratory effort appears normal.   Abdomen: Soft, nontender and nondistended. Bowel sounds are present and normoactive. No organomegaly is appreciated  Musculoskeletal: No edema. No cyanosis. Extremities are warm and well-perfused.   Hematologic: No bruising, purpura or petechiae are noted.   Dermatologic: No rashes appreciated.   Lymphatic: No palpable cervical, supraclavicular, axillary or inguinal adenopathy appreciated.   Psychiatric: Affect appropriate.  Pleasant and conversant  Breast: Not re-examined    Recent Labs:  Lab Results   Component Value Date    NA 141 12/13/2018    K 4.3 12/13/2018    CL 102 12/13/2018    CO2 24 12/13/2018    CREAT 0.78 12/13/2018    BUN 14 12/13/2018    GLUCOSE 63 (L) 12/13/2018 CALCIUM 9.7 12/13/2018     No results found for: WBC, HGB, HCT, MCV, PLT, NEUTPCT, NEUTABS, MONOPCT, MONOABS  Lab Results   Component Value Date    TOTPRO 7.6 12/13/2018    ALBUMIN 4.7 12/13/2018    BILITOT 0.4 12/13/2018    AST 19 12/13/2018    ALT 13 12/13/2018    ALKPHOS 46 12/13/2018     12/13/18: CEA = 0.8 27.29 = 27    Pertinent Imaging:  Per HPI     Pertinent Pathology:  Per HPI     Impression and Recommendations:  Jenna Newman is a 48 y.o. female with     1. Stage IIB pT2 (3.6 cm) cN1a (1/10 - 8 mm) cM0 RIGHT breast IDC,  ER/PR > 95%, HER-2 FISH negative, Ki 67 25%, grade 2/3, SBR 7/9, +LVI, -PNI, and intermediate to high-grade DCIS, ultimately taken to clear margins with August 2020 nipple sparing mastectomy. Recurrence Score = 16.   2. Anxiety surrounding health  3. Myriad My Risk germline mutation analysis: negative    45 minutes in face to face contact was spent with [facility] and her husband in education, counseling and formulating our adjuvant treatment plan moving forward. After a thorough discussion of her goals and values of care, and looking at her prognostic and predictive data as outlined above, along with her treatment options (adjuvant chemo followed by endocrine rx using TAM->AI vs OS/AI vs adjuvant endocrine therapy alone discussing TAM->AI vs OS/AI, etc), the patient has decided on the following course, in hopes of improving both disease free and overall survival.     - plan for adjuvant Taxotere/Cytoxan chemotherapy x 4 cycles (risks, benefits and side effect profile discussion at length, in addition to primary growth factor support in context of COVID) once cleared by surgery  - zofran and ativan prn anti emetics have been sent to pharmacy, reviewed indications as needed at home  - patient is exceedingly anxious regarding chemotherapy related nausea and vomiting, stating that she is very easily nauseated, and with that said, we are requesting Emend as premedication in addition to 5HT3 inhibitor and steroid  - consider local cancer center counselor to help with emotional/psychologic aspects of dealing with cancer, will continue this discussion moving forward  - adjuvant endocrine therapy to begin following chemotherapy completion, will again review options at that time looking at TAM vs OS/TAM vs OS/AI  - will likely utilize BCI moving forward to understand possible role for extended endocrine therapy    Return to clinic:  Return for see MD with chemo after day 1 TC .    Orders:  Orders Placed This Encounter   ??? CBC & Auto Differential   ??? Comprehensive Metabolic Panel   ??? DISCONTD: diazePAM 5 mg tablet   ??? ondansetron 8 mg tablet   ??? LORazepam 1 mg tablet   ??? dexamethasone 4 mg tablet       Alric Seton. Zoila Shutter, MD  Assistant Professor of Medicine  Division of Hematology/Oncology  Phone: [phone]  Fax: [phone]  Email: [email or web address]    Attending Physician: Alric Seton. Zoila Shutter, MD.  Author: Alric Seton. Zoila Shutter, MD

## 2019-03-26 LAB — Comprehensive Metabolic Panel
CALCIUM: 8.9 mg/dL (ref 8.6–10.4)
TOTAL PROTEIN: 6.3 g/dL (ref 6.1–8.2)

## 2019-03-27 ENCOUNTER — Ambulatory Visit: Payer: PRIVATE HEALTH INSURANCE

## 2019-03-31 ENCOUNTER — Ambulatory Visit: Payer: PRIVATE HEALTH INSURANCE

## 2019-04-29 ENCOUNTER — Ambulatory Visit: Payer: PRIVATE HEALTH INSURANCE

## 2019-04-30 ENCOUNTER — Telehealth: Payer: PRIVATE HEALTH INSURANCE

## 2019-04-30 ENCOUNTER — Ambulatory Visit: Payer: PRIVATE HEALTH INSURANCE

## 2019-04-30 NOTE — Telephone Encounter
S/w pt to clarify when she needs to take her oral dexamethasone.

## 2019-05-01 ENCOUNTER — Ambulatory Visit: Payer: PRIVATE HEALTH INSURANCE | Attending: Hematology & Oncology

## 2019-05-01 DIAGNOSIS — D72829 Elevated white blood cell count, unspecified: Secondary | ICD-10-CM

## 2019-05-01 DIAGNOSIS — Z170 Estrogen receptor positive status [ER+]: Secondary | ICD-10-CM

## 2019-05-01 DIAGNOSIS — C50911 Malignant neoplasm of unspecified site of right female breast: Secondary | ICD-10-CM

## 2019-05-01 DIAGNOSIS — C773 Secondary and unspecified malignant neoplasm of axilla and upper limb lymph nodes: Secondary | ICD-10-CM

## 2019-05-01 DIAGNOSIS — Z5111 Encounter for antineoplastic chemotherapy: Secondary | ICD-10-CM

## 2019-05-01 MED ADMIN — DEXAMETHASONE SODIUM PHOSPHATE 4 MG/ML IJ SOLN: 8 mg | INTRAVENOUS | @ 17:00:00 | Stop: 2019-05-01

## 2019-05-01 MED ADMIN — DOCETAXEL CHEMO INFUSION >100 MG: 124 mg | INTRAVENOUS | @ 19:00:00 | Stop: 2019-05-01

## 2019-05-01 MED ADMIN — DOCETAXEL CHEMO INFUSION >100 MG: 124 mg | INTRAVENOUS | @ 18:00:00 | Stop: 2019-05-01

## 2019-05-01 MED ADMIN — CYCLOPHOSPHAMIDE CHEMO INFUSION: 1000 mg | INTRAVENOUS | @ 19:00:00 | Stop: 2019-05-01

## 2019-05-01 MED ADMIN — PALONOSETRON HCL 0.25 MG/5ML IV SOLN: .25 mg | INTRAVENOUS | @ 17:00:00 | Stop: 2019-05-01

## 2019-05-01 MED ADMIN — FAMOTIDINE 20 MG/2ML IV SOLN: 20 mg | INTRAVENOUS | @ 17:00:00 | Stop: 2019-05-01

## 2019-05-01 MED ADMIN — FOSAPREPITANT (EMEND) IVPB: 150 mg | INTRAVENOUS | @ 17:00:00 | Stop: 2019-05-01

## 2019-05-01 MED ADMIN — DIPHENHYDRAMINE HCL 50 MG/ML IJ SOLN: 50 mg | INTRAVENOUS | @ 17:00:00 | Stop: 2019-05-01

## 2019-05-01 NOTE — Nursing Note
Jenna Newman is a 48 y.o. female in the infusion area today for C1 TC. Pt had a F/U with Dr. Lutricia Feil prior to treatment.  Pt took prescribed dexamethasone yesterday. Had difficult time sleeping.  Will try taking it earlier in the day prior to next chemo.  Pt anxious about possible chemo side effects. Pt has repeatedly talked about how sensitive she is to medication and how prone she is to nausea.  Reviewed antiemetic regimen and management of possible side effects.  CBC drawn and results reviewed with Dr. Lutricia Feil.  Ok to proceed with chemo today.  Pt was cleared by plastic surgeon this week.  Reminded pt to contact her surgeon immediately if she notices any changes to her right reconstruction site.    Tolerated treatment without incident/adverse event.    O:   Lab Results   Component Value Date    WBC 21.3 (HH) 05/01/2019    HGB 12.3 05/01/2019    HCT 36.0 05/01/2019    PLT 255 05/01/2019    NEUTABS 19.95 (H) 05/01/2019    CREAT 0.76 03/25/2019    K 4.3 03/25/2019    BILITOT <0.2 03/25/2019       A:  Encounter Diagnoses   Name Primary?    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 C2 on 10/22  Pt left clinic in stable condition.

## 2019-05-01 NOTE — Progress Notes
Patient name:  Jenna Newman  MRN:  [id number]  DOB:  [birth year]  CSN: [id number]  Date of encounter: 05/01/2019  Referring provider: No ref. provider found  PCP: Melanie Crazier, MD  Provider: Alric Seton. 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     She has unfortunately had some post reconstruction related seroma issues on the right, with repeat drain's required with plastic surgery. I have discussed case with plastics, who has cleared her to start cycle 1 chemotherapy today, no evidence of infection. She is being followed closely. She is being seen by PT several days per week as well.    Her anxiety is requiring xanax most nights of the week. No depression. Anxious regarding chemo side effects.     No headaches, vision changes, anorexia, rapid weight loss, nausea, vomiting, fevers, sweats, chills, cough, chest pain or bone pain.     ONCOLOGY HISTORY:   11/27/18 b/l dx tomosynthesis mammo at [facility]: heterogeneously dense breast tissue. Distortion in the RUO breast peri-areolar region without suspicious calcifications.   11/27/18 RIGHT breast US: 15 x 16 x 22 mm mass at 11:00, 6 cm from the nipple, with a 1.4 cm extension of the mass off the superior aspect appearing continuous by a thin bridge of tissue. No frank right axillary adenopathy  12/06/18 US guided right breast bx: IDC, 8 mm, grade 2/3, SBR 7/9, ER/PR > 95%, Ki 67 25%, HER-2 FISH negative, with background grade 3 DCIS, cribriform pattern. No LCIS, no LVI, no microcalcifications 12/25/18 Genetics consultation: Myriad My Risk germline negative.   12/25/18 MRI breasts at [facility]: 2.8 x 2.1 x 2.2 cm mass at 11:00 axi right breast middle to anterior depth 5 cm from nipple, no e/o multi focal or multi centric or contralateral disease. No e/o nodal or metastatic dz seen.   12/31/18 Dr. Shanda Howells Surgical Oncology consult  01/27/19 RIGHT lumpectomy/SLN bx and LEFT breast reduction mammoplasty:   RIGHT: 3.6 cm IDC, grade 2/3, MBR 6/9, +LVI, no PNI, with 1 of 2 sentinel nodes positive for 8 mm metastatic cancer, with 3.5 cm DCIS, intermediate grade, comedo-type, cribriform and solid, with focally + CIS margins at superior/medial, invasive carcinoma margins uninvolved  LEFT: benign, no in situ or invasive dz.   02/17/19 RIGHT breast re-excision: UDH, apocrine metaplasia, no residual invasive carcinoma, focal residual DCIS measuring 16 mm, 0.2 mm from new lateral margin.   03/17/19 RIGHT nipple sparing mastectomy and level 1/2 ax node dissection with reconstruction (Dr. Renaldo Reel): focal, residual 3 mm DCIS, 6 mm from closest margin, no residual invasive cancer, ADH free of margins by 1.3 cm, 8 axillary nodes removed, all negative.   05/01/19 C1 TC    ECOG performance status: 0    Objective:  Current medications:  Outpatient Medications Prior to Visit   Medication Sig   ??? cetirizine 10 mg tablet Take 10 mg by mouth every other day.   ??? LORazepam 1 mg tablet Take 1 tablet (1 mg total) by mouth every six (6) hours as needed (breakthrough nausea or anxiety). Max Daily Amount: 4 mg   ??? ondansetron 8  mg tablet Take 1 tablet (8 mg total) by mouth every eight (8) hours as needed for Nausea.   ??? tamoxifen 20 mg tablet Take 1 tablet (20 mg total) by mouth daily.     No facility-administered medications prior to visit.        Allergies:  No Known Allergies    Review of Systems:  A 14 point review of systems was negative besides what I note in my HPI above.     Past Medical History:  Past Medical History: Diagnosis Date   ??? Breast cancer (HCC/RAF)        Past Surgical History:  Past Surgical History:   Procedure Laterality Date   ??? BREAST FIBROADENOMA SURGERY Bilateral 1992   ??? BREAST LUMPECTOMY Right 12/2018   ??? BREAST LUMPECTOMY Bilateral 01/27/2019   ??? CESAREAN SECTION  2006 & 2008   ??? CHOLECYSTECTOMY  2004   ??? KNEE SURGERY Left 2017    Ligament release    ??? MASTECTOMY Right 03/2019   ??? TONSILECTOMY, ADENOIDECTOMY, BILATERAL MYRINGOTOMY AND TUBES  1983   ??? TUBAL LIGATION  2016       Social History:  Social History     Tobacco Use   ??? Smoking status: Never Smoker   ??? Smokeless tobacco: Never Used   Substance Use Topics   ??? Alcohol use: Not Currently     Family History:  History reviewed. No pertinent family history.     Vitals:  BP: 109/72 (10/01 0827)  Temp: 36.5 ???C (97.7 ???F) (10/01 0827)  Temp Source: Forehead (10/01 0827)  Heart Rate: 99 (10/01 0827)  Resp: 20 (10/01 0827)  SpO2: 97 % (10/01 0827)  Height: 160.3 cm (10/01 0827)  Weight: 62 kg (136 lb 9.6 oz) (10/01 0827)    Physical Exam:  General: Appears well-developed, well-nourished and close to stated age.   Head: Normocephalic, atraumatic.  Eyes: PERRL without icterus.   ENT: Oropharynx is clear, mucus membranes are moist.    CV: Regular in rate and rhythm, no murmurs or gallops.   Chest: Clear to auscultation bilaterally without wheezing or rhonchi.  Respiratory effort appears normal.   Abdomen: Soft, nontender and nondistended. Bowel sounds are present and normoactive. No organomegaly is appreciated  Musculoskeletal: No edema. No cyanosis. Extremities are warm and well-perfused.   Hematologic: No bruising, purpura or petechiae are noted.   Dermatologic: No rashes appreciated.   Lymphatic: No palpable cervical, supraclavicular, axillary or inguinal adenopathy appreciated.   Psychiatric: Affect appropriate.  Pleasant and conversant  Breast: B/L reconstruction appreciated, no evidence of cellulitis, no e/o abscess, left breast negative.     Recent Labs: Lab Results   Component Value Date    NA 142 03/25/2019    K 4.3 03/25/2019    CL 106 03/25/2019    CO2 20 03/25/2019    CREAT 0.76 03/25/2019    BUN 10 03/25/2019    GLUCOSE 97 03/25/2019    CALCIUM 8.9 03/25/2019     Lab Results   Component Value Date    WBC 21.3 (HH) 05/01/2019    HGB 12.3 05/01/2019    HCT 36.0 05/01/2019    MCV 91.8 05/01/2019    PLT 255 05/01/2019    NEUTPCT 93.9 (H) 05/01/2019    NEUTABS 19.95 (H) 05/01/2019    MONOPCT 3.4 (L) 05/01/2019    MONOABS 0.73 05/01/2019     Lab Results   Component Value Date    TOTPRO 6.3 03/25/2019    ALBUMIN 3.9  03/25/2019    BILITOT <0.2 03/25/2019    AST 31 03/25/2019    ALT 36 03/25/2019    ALKPHOS 58 03/25/2019     12/13/18: CEA = 0.8 27.29 = 27    Pertinent Imaging:  Per HPI     Pertinent Pathology:  Per HPI     Impression and Recommendations:  RUKIYA HODGKINS is a 48 y.o. female with     1. Stage IIB pT2 (3.6 cm) cN1a (1/10 - 8 mm) cM0 RIGHT breast IDC, ER/PR > 95%, HER-2 FISH negative, Ki 67 25%, grade 2/3, SBR 7/9, +LVI, -PNI, and intermediate to high-grade DCIS, ultimately taken to clear margins with August 2020 nipple sparing mastectomy, RS= 16, here for cycle 1 adjuvant TC chemotherapy  2. Anxiety - stable  3. Myriad My Risk germline mutation analysis: negative  4. Leukocytosis, felt possibly reactive to recent surgery's/interventions, no evidence of infection with prior drainage material, no evidence of cellulitis, no other signs/symptoms consistent with infection, monitor    - reviewed role, rationale, alternatives, toxicity profile of TC and primary growth factor prophylaxis again  - return/ER precautions reviewed at length, patient endorsed understanding  - reviewed recommendations for prn anti emetic use at home  - adjuvant endocrine therapy to begin following chemotherapy completion, will again review TAM vs OS/TAM vs OS/AI  - will utilize [facility] moving forward to understand possible role for extended endocrine therapy    Return to clinic: Return in about 3 weeks (around 05/22/2019) for see MD with chemo after.    Orders:  No orders of the defined types were placed in this encounter.      Alric Seton. Zoila Shutter, MD  Assistant Professor of Medicine  Division of Hematology/Oncology  Phone: [phone]  Fax: [phone]  Email: [email or web address]    Attending Physician: Alric Seton. Zoila Shutter, MD.  Author: Alric Seton. Zoila Shutter, MD

## 2019-05-02 MED ADMIN — PEGFILGRASTIM-JMDB 6 MG/0.6ML SC SOSY: 6 mg | SUBCUTANEOUS | @ 20:00:00 | Stop: 2019-05-02

## 2019-05-05 ENCOUNTER — Ambulatory Visit: Payer: PRIVATE HEALTH INSURANCE

## 2019-05-05 DIAGNOSIS — N390 Urinary tract infection, site not specified: Secondary | ICD-10-CM

## 2019-05-07 ENCOUNTER — Ambulatory Visit: Payer: PRIVATE HEALTH INSURANCE

## 2019-05-22 ENCOUNTER — Ambulatory Visit: Payer: PRIVATE HEALTH INSURANCE | Attending: Hematology & Oncology

## 2019-05-22 DIAGNOSIS — Z170 Estrogen receptor positive status [ER+]: Secondary | ICD-10-CM

## 2019-05-22 DIAGNOSIS — C773 Secondary and unspecified malignant neoplasm of axilla and upper limb lymph nodes: Secondary | ICD-10-CM

## 2019-05-22 DIAGNOSIS — C50911 Malignant neoplasm of unspecified site of right female breast: Secondary | ICD-10-CM

## 2019-05-22 DIAGNOSIS — Z5111 Encounter for antineoplastic chemotherapy: Secondary | ICD-10-CM

## 2019-05-22 LAB — Comprehensive Metabolic Panel
ALBUMIN: 4.7 g/dL (ref 3.9–5.0)
GFR ESTIMATE FOR NON-AFRICAN AMERICAN: >89 mL/min/{1.73_m2} (ref 13–47)

## 2019-05-22 MED ADMIN — PALONOSETRON HCL 0.25 MG/5ML IV SOLN: .25 mg | INTRAVENOUS | @ 19:00:00 | Stop: 2019-05-22

## 2019-05-22 MED ADMIN — DIPHENHYDRAMINE HCL 50 MG/ML IJ SOLN: 50 mg | INTRAVENOUS | @ 19:00:00 | Stop: 2019-05-22

## 2019-05-22 MED ADMIN — CYCLOPHOSPHAMIDE CHEMO INFUSION: 1000 mg | INTRAVENOUS | @ 22:00:00 | Stop: 2019-05-22

## 2019-05-22 MED ADMIN — DOCETAXEL CHEMO INFUSION >100 MG: 124 mg | INTRAVENOUS | @ 21:00:00 | Stop: 2019-05-22

## 2019-05-22 MED ADMIN — FOSAPREPITANT (EMEND) IVPB: 150 mg | INTRAVENOUS | @ 19:00:00 | Stop: 2019-05-22

## 2019-05-22 MED ADMIN — DEXAMETHASONE SODIUM PHOSPHATE 4 MG/ML IJ SOLN: 8 mg | INTRAVENOUS | @ 19:00:00 | Stop: 2019-05-22

## 2019-05-22 MED ADMIN — DOCETAXEL CHEMO INFUSION >100 MG: 124 mg | INTRAVENOUS | @ 19:00:00 | Stop: 2019-05-22

## 2019-05-22 MED ADMIN — FAMOTIDINE 20 MG/2ML IV SOLN: 20 mg | INTRAVENOUS | @ 19:00:00 | Stop: 2019-05-22

## 2019-05-22 NOTE — Progress Notes
Patient name:  Jenna Newman  MRN:  [id number]  DOB:  11-Jul-1971  CSN: [id number]  Date of encounter: 05/22/2019  Referring provider: No ref. provider found  PCP: Melanie Crazier, MD  Provider: Alric Seton. 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     She had expected post chemo related fatigue, malaise, MSK discomfort, for about 5-7 days. Tylenol helped with pain, only needed several tabs. Had some anxiety/insomnia at night time, worsened nausea, no emesis, ativan worked best. No significant neuropathy. No fevers, sweats, chills. No constipation or diarrhea.     ONCOLOGY HISTORY:   11/27/18 b/l dx tomosynthesis mammo at [facility]: heterogeneously dense breast tissue. Distortion in the RUO breast peri-areolar region without suspicious calcifications.   11/27/18 RIGHT breast US: 15 x 16 x 22 mm mass at 11:00, 6 cm from the nipple, with a 1.4 cm extension of the mass off the superior aspect appearing continuous by a thin bridge of tissue. No frank right axillary adenopathy  12/06/18 US guided right breast bx: IDC, 8 mm, grade 2/3, SBR 7/9, ER/PR > 95%, Ki 67 25%, HER-2 FISH negative, with background grade 3 DCIS, cribriform pattern. No LCIS, no LVI, no microcalcifications  12/25/18 Genetics consultation: Myriad My Risk germline negative.   12/25/18 MRI breasts at [facility]: 2.8 x 2.1 x 2.2 cm mass at 11:00 axi right breast middle to anterior depth 5 cm from nipple, no e/o multi focal or multi centric or contralateral disease. No e/o nodal or metastatic dz seen.   12/31/18 Dr. Shanda Howells Surgical Oncology consult 01/27/19 RIGHT lumpectomy/SLN bx and LEFT breast reduction mammoplasty:   RIGHT: 3.6 cm IDC, grade 2/3, MBR 6/9, +LVI, no PNI, with 1 of 2 sentinel nodes positive for 8 mm metastatic cancer, with 3.5 cm DCIS, intermediate grade, comedo-type, cribriform and solid, with focally + CIS margins at superior/medial, invasive carcinoma margins uninvolved  LEFT: benign, no in situ or invasive dz.   02/17/19 RIGHT breast re-excision: UDH, apocrine metaplasia, no residual invasive carcinoma, focal residual DCIS measuring 16 mm, 0.2 mm from new lateral margin.   03/17/19 RIGHT nipple sparing mastectomy and level 1/2 ax node dissection with reconstruction (Dr. Renaldo Reel): focal, residual 3 mm DCIS, 6 mm from closest margin, no residual invasive cancer, ADH free of margins by 1.3 cm, 8 axillary nodes removed, all negative.   05/01/19 C1 TC  05/22/19 C2 TC    ECOG performance status: 0    Objective:  Current medications:  Outpatient Medications Prior to Visit   Medication Sig   ? cetirizine 10 mg tablet Take 10 mg by mouth every other day.   ? LORazepam 1 mg tablet Take 1 tablet (1 mg total) by mouth every six (6) hours as needed (breakthrough nausea or anxiety). Max Daily Amount: 4 mg   ? ondansetron 8 mg tablet Take 1 tablet (8 mg total) by mouth every eight (8) hours as needed for Nausea.   ? tamoxifen 20 mg tablet Take 1 tablet (20 mg total) by mouth daily.     No facility-administered medications prior to visit.  Allergies:  No Known Allergies    Review of Systems:  A 14 point review of systems was negative besides what I note in my HPI above.     Past Medical History:  Past Medical History:   Diagnosis Date   ? Breast cancer (HCC/RAF)        Past Surgical History:  Past Surgical History:   Procedure Laterality Date   ? BREAST FIBROADENOMA SURGERY Bilateral 1992   ? BREAST LUMPECTOMY Right 12/2018   ? BREAST LUMPECTOMY Bilateral 01/27/2019   ? CESAREAN SECTION  2006 & 2008   ? CHOLECYSTECTOMY  2004 ? KNEE SURGERY Left 2017    Ligament release    ? MASTECTOMY Right 03/2019   ? TONSILECTOMY, ADENOIDECTOMY, BILATERAL MYRINGOTOMY AND TUBES  1983   ? TUBAL LIGATION  2016       Social History:  Social History     Tobacco Use   ? Smoking status: Never Smoker   ? Smokeless tobacco: Never Used   Substance Use Topics   ? Alcohol use: Not Currently     Family History:  No family history on file.     Vitals:  BP: 99/63 (10/22 0950)  Temp: 36.3 ?C (97.3 ?F) (10/22 0950)  Temp Source: Forehead (10/22 0950)  Heart Rate: 91 (10/22 0950)  Resp: 20 (10/22 0950)  SpO2: 97 % (10/22 0950)  Height: 160.9 cm (10/22 0950)  Weight: 61.4 kg (135 lb 6.4 oz) (10/22 0950)    Physical Exam:  General: Appears well-developed, well-nourished and close to stated age.   Head: Normocephalic, atraumatic.  Eyes: PERRL without icterus.   ENT: Oropharynx is clear, mucus membranes are moist.    CV: Regular in rate and rhythm, no murmurs or gallops.   Chest: Clear to auscultation bilaterally without wheezing or rhonchi.  Respiratory effort appears normal.   Abdomen: Soft, nontender and nondistended. Bowel sounds are present and normoactive. No organomegaly is appreciated  Musculoskeletal: No edema. No cyanosis. Extremities are warm and well-perfused.   Hematologic: No bruising, purpura or petechiae are noted.   Dermatologic: No rashes appreciated.   Lymphatic: No palpable cervical, supraclavicular, axillary or inguinal adenopathy appreciated.   Psychiatric: Affect appropriate.  Pleasant and conversant  Breast: B/L reconstruction appreciated    Recent Labs:  Lab Results   Component Value Date    NA 142 03/25/2019    K 4.3 03/25/2019    CL 106 03/25/2019    CO2 20 03/25/2019    CREAT 0.76 03/25/2019    BUN 10 03/25/2019    GLUCOSE 97 03/25/2019    CALCIUM 8.9 03/25/2019     Lab Results   Component Value Date    WBC 19.9 (HH) 05/22/2019    HGB 12.4 05/22/2019    HCT 36.7 05/22/2019    MCV 92.4 05/22/2019    PLT 359 05/22/2019 NEUTPCT 93.5 (H) 05/22/2019    NEUTABS 18.63 (H) 05/22/2019    MONOPCT 3.4 (L) 05/22/2019    MONOABS 0.67 05/22/2019     Lab Results   Component Value Date    TOTPRO 6.3 03/25/2019    ALBUMIN 3.9 03/25/2019    BILITOT <0.2 03/25/2019    AST 31 03/25/2019    ALT 36 03/25/2019    ALKPHOS 58 03/25/2019     12/13/18: CEA = 0.8, [place] 27.29 = 27    Pertinent Imaging:  Per HPI     Pertinent Pathology:  Per HPI     Impression and Recommendations:  Lovina Reach  is a 48 y.o. female with     1. Stage IIB pT2 (3.6 cm) cN1a (1/10 - 8 mm) cM0 RIGHT breast IDC, ER/PR > 95%, HER-2 FISH negative, Ki 67 25%, grade 2/3, SBR 7/9, +LVI, -PNI, and intermediate to high-grade DCIS, ultimately taken to clear margins with August 2020 nipple sparing mastectomy, RS= 16, here for cycle 2 adjuvant TC chemotherapy  2. Anxiety - stable  3. Myriad My Risk germline mutation analysis: negative  4. Leukocytosis, felt related to growth factor, no signs/sx infection - monitor    - again reviewed TC and peg filgastrim side effect profiles  - lowering peg filgastrim to 3 mg tomorrow  - adjuvant endocrine therapy following chemotherapy completion, will again review TAM vs OS/TAM vs OS/AI  - [facility] moving forward to understand possible role of extended endocrine therapy    Return to clinic:  Return in about 3 weeks (around 06/12/2019) for see MD with chemo after.    Orders:  No orders of the defined types were placed in this encounter.      Alric Seton. Zoila Shutter, MD  Assistant Professor of Medicine  Division of Hematology/Oncology  Phone: [phone]  Fax: [phone]  Email: [email or web address]    Attending Physician: Alric Seton. Zoila Shutter, MD.  Author: Alric Seton. Zoila Shutter, MD

## 2019-05-22 NOTE — Nursing Note
Jenna Newman is a 48 y.o. female in the infusion area today for C2 TC. Pt had a F/U with Dr. Lutricia Feil prior to treatment.  Pt denies fevers, cough, sob. Denies n/v/d.  Taking stool softener for constipation. Had fatigue and MSK post cycle 1. Feeling good today.  Labs drawn per treatment orders. CBC results reviewed with Dr. Lutricia Feil. Ok to treat today.  Tolerated treatment without incident/adverse event.    O:   Lab Results   Component Value Date    WBC 19.9 (HH) 05/22/2019    HGB 12.4 05/22/2019    HCT 36.7 05/22/2019    PLT 359 05/22/2019    NEUTABS 18.63 (H) 05/22/2019    CREAT 0.76 03/25/2019    K 4.3 03/25/2019    BILITOT <0.2 03/25/2019       A:  Encounter Diagnoses   Name Primary?    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 C3 on 11/12  Pt left clinic in stable condition.

## 2019-05-23 MED ADMIN — PEGFILGRASTIM-JMDB 6 MG/0.6ML SC SOSY: 3 mg | SUBCUTANEOUS | @ 22:00:00 | Stop: 2019-05-23

## 2019-05-30 ENCOUNTER — Ambulatory Visit: Payer: PRIVATE HEALTH INSURANCE

## 2019-05-30 MED ORDER — LORAZEPAM 1 MG PO TABS
1 mg | ORAL_TABLET | Freq: Four times a day (QID) | ORAL | 0 refills | Status: AC | PRN
Start: 2019-05-30 — End: ?

## 2019-06-12 ENCOUNTER — Ambulatory Visit: Payer: PRIVATE HEALTH INSURANCE | Attending: Hematology & Oncology

## 2019-06-12 ENCOUNTER — Telehealth: Payer: PRIVATE HEALTH INSURANCE

## 2019-06-12 ENCOUNTER — Ambulatory Visit: Payer: PRIVATE HEALTH INSURANCE

## 2019-06-12 DIAGNOSIS — D0510 Intraductal carcinoma in situ of unspecified breast: Secondary | ICD-10-CM

## 2019-06-12 DIAGNOSIS — C773 Secondary and unspecified malignant neoplasm of axilla and upper limb lymph nodes: Secondary | ICD-10-CM

## 2019-06-12 DIAGNOSIS — C771 Secondary and unspecified malignant neoplasm of intrathoracic lymph nodes: Secondary | ICD-10-CM

## 2019-06-12 DIAGNOSIS — Z170 Estrogen receptor positive status [ER+]: Secondary | ICD-10-CM

## 2019-06-12 DIAGNOSIS — C50911 Malignant neoplasm of unspecified site of right female breast: Secondary | ICD-10-CM

## 2019-06-12 LAB — Comprehensive Metabolic Panel
CREATININE: 0.54 mg/dL — ABNORMAL LOW (ref 0.60–1.30)
CREATININE: 0.54 mg/dL — ABNORMAL LOW (ref 0.60–1.30)

## 2019-06-12 MED ADMIN — CYCLOPHOSPHAMIDE CHEMO INFUSION: 1000 mg | INTRAVENOUS | Stop: 2019-06-12

## 2019-06-12 MED ADMIN — FAMOTIDINE 20 MG/2ML IV SOLN: 20 mg | INTRAVENOUS | @ 20:00:00 | Stop: 2019-06-12

## 2019-06-12 MED ADMIN — DEXAMETHASONE SODIUM PHOSPHATE 4 MG/ML IJ SOLN: 8 mg | INTRAVENOUS | @ 20:00:00 | Stop: 2019-06-12

## 2019-06-12 MED ADMIN — FOSAPREPITANT (EMEND) IVPB: 150 mg | INTRAVENOUS | @ 20:00:00 | Stop: 2019-06-12

## 2019-06-12 MED ADMIN — DOCETAXEL CHEMO INFUSION >100 MG: 124 mg | INTRAVENOUS | @ 20:00:00 | Stop: 2019-06-12

## 2019-06-12 MED ADMIN — PALONOSETRON HCL 0.25 MG/5ML IV SOLN: .25 mg | INTRAVENOUS | @ 20:00:00 | Stop: 2019-06-12

## 2019-06-12 MED ADMIN — FOSAPREPITANT (EMEND) IVPB: 150 mg | INTRAVENOUS | @ 19:00:00 | Stop: 2019-06-12

## 2019-06-12 MED ADMIN — DIPHENHYDRAMINE HCL 50 MG/ML IJ SOLN: 50 mg | INTRAVENOUS | @ 20:00:00 | Stop: 2019-06-12

## 2019-06-12 MED ADMIN — DOCETAXEL CHEMO INFUSION >100 MG: 124 mg | INTRAVENOUS | @ 22:00:00 | Stop: 2019-06-12

## 2019-06-12 NOTE — Telephone Encounter
Reply by: Clarabell Matsuoka Melody Mosallaei-Benjamin  Ok that is fine

## 2019-06-12 NOTE — Telephone Encounter
Reply by: Keliah Harned Segura Kyl Givler  Thank you

## 2019-06-12 NOTE — Nursing Note
Jenna Newman is a 48 y.o. female in the infusion area today for C3 TC. Pt had a F/U with Dr. Lutricia Feil prior to treatment.  Pt denies fevers, chills, sob.  Pt had constipation followed by diarrhea after her last chemo.  Reviewed bowel regimen options, ie, colace, senna or miralax, with pt. Pt endorsed understanding.  Pt had no MSK with lower fulphila dose.    Labs drawn per treatment orders.      Tolerated treatment without incident/adverse event.    O:   Lab Results   Component Value Date    WBC 15.0 (H) 06/12/2019    HGB 11.8 06/12/2019    HCT 34.9 06/12/2019    PLT 241 06/12/2019    NEUTABS 14.27 (H) 06/12/2019    CREAT 0.63 05/22/2019    K 3.7 05/22/2019    BILITOT 0.3 05/22/2019       A:  Encounter Diagnoses   Name Primary?    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 Next chemo on 12/3  Pt left clinic in stable condition.

## 2019-06-12 NOTE — Telephone Encounter
Reply by: Ziv Welchel Segura Antoinetta Berrones  Thank you doctor

## 2019-06-12 NOTE — Telephone Encounter
Reply by: Padme Arriaga Melody Mosallaei-Benjamin  That is fine

## 2019-06-12 NOTE — Telephone Encounter
Reply by: Foster Frericks Melody Mosallaei-Benjamin  Or she can get it done at [facility]

## 2019-06-12 NOTE — Telephone Encounter
I have called NM department in [place]  advised soonest available in that location is   10/01/2019. She mentioned [facility] might have a   sooner. I have called [facility] & the soonest appt  Is 06/17/2019 which I booked just in case.    Please advise if ok    Thank you

## 2019-06-12 NOTE — Progress Notes
Patient name:  Jenna Newman  MRN:  [id number]  DOB:  Jun 09, 1971  CSN: [id number]  Date of encounter: 06/12/2019  Referring provider: No ref. 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     She had expected post chemo related fatigue, malaise, MSK discomfort, for about 5-7 days. Tylenol helped with pain, only needed several tabs. Had some anxiety/insomnia at night time, worsened nausea, no emesis, ativan worked best. No significant neuropathy. No fevers, sweats, chills. C/o lower back pain that has started in the last month.  Patient reporting for the first time that she has bigeminy and has been feeling her heart rhythm change more recently.  Not seeing a cardiologist at this time.    ONCOLOGY HISTORY:   11/27/18 b/l dx tomosynthesis mammo at [facility]: heterogeneously dense breast tissue. Distortion in the RUO breast peri-areolar region without suspicious calcifications.   11/27/18 RIGHT breast US: 15 x 16 x 22 mm mass at 11:00, 6 cm from the nipple, with a 1.4 cm extension of the mass off the superior aspect appearing continuous by a thin bridge of tissue. No frank right axillary adenopathy  12/06/18 US guided right breast bx: IDC, 8 mm, grade 2/3, SBR 7/9, ER/PR > 95%, Ki 67 25%, HER-2 FISH negative, with background grade 3 DCIS, cribriform pattern. No LCIS, no LVI, no microcalcifications  12/25/18 Genetics consultation: Myriad My Risk germline negative. 12/25/18 MRI breasts at [facility]: 2.8 x 2.1 x 2.2 cm mass at 11:00 axi right breast middle to anterior depth 5 cm from nipple, no e/o multi focal or multi centric or contralateral disease. No e/o nodal or metastatic dz seen.   12/31/18 Dr. Shanda Howells Surgical Oncology consult  01/27/19 RIGHT lumpectomy/SLN bx and LEFT breast reduction mammoplasty:   RIGHT: 3.6 cm IDC, grade 2/3, MBR 6/9, +LVI, no PNI, with 1 of 2 sentinel nodes positive for 8 mm metastatic cancer, with 3.5 cm DCIS, intermediate grade, comedo-type, cribriform and solid, with focally + CIS margins at superior/medial, invasive carcinoma margins uninvolved  LEFT: benign, no in situ or invasive dz.   02/17/19 RIGHT breast re-excision: UDH, apocrine metaplasia, no residual invasive carcinoma, focal residual DCIS measuring 16 mm, 0.2 mm from new lateral margin.   03/17/19 RIGHT nipple sparing mastectomy and level 1/2 ax node dissection with reconstruction (Dr. Renaldo Reel): focal, residual 3 mm DCIS, 6 mm from closest margin, no residual invasive cancer, ADH free of margins by 1.3 cm, 8 axillary nodes removed, all negative.   05/01/19 C1 TC  05/22/19 C2 TC  06/12/19 C3 TC  ECOG performance status: 0    Objective:  Current medications:  Outpatient Medications Prior to Visit   Medication Sig   ? cetirizine 10 mg tablet Take 10 mg by mouth every other day.   ? LORazepam 1 mg tablet Take 1 tablet (1 mg total) by mouth every six (6) hours as needed (breakthrough nausea or anxiety). Max Daily Amount: 4 mg   ? ondansetron 8 mg tablet Take 1 tablet (8 mg total) by mouth every eight (8) hours as  needed for Nausea.   ? tamoxifen 20 mg tablet Take 1 tablet (20 mg total) by mouth daily.     No facility-administered medications prior to visit.        Allergies:  No Known Allergies    Review of Systems:  A 14 point review of systems was negative besides what I note in my HPI above.     Past Medical History:  Past Medical History:   Diagnosis Date   ? Breast cancer (HCC/RAF) Past Surgical History:  Past Surgical History:   Procedure Laterality Date   ? BREAST FIBROADENOMA SURGERY Bilateral 1992   ? BREAST LUMPECTOMY Right 12/2018   ? BREAST LUMPECTOMY Bilateral 01/27/2019   ? CESAREAN SECTION  2006 & 2008   ? CHOLECYSTECTOMY  2004   ? KNEE SURGERY Left 2017    Ligament release    ? MASTECTOMY Right 03/2019   ? TONSILECTOMY, ADENOIDECTOMY, BILATERAL MYRINGOTOMY AND TUBES  1983   ? TUBAL LIGATION  2016       Social History:  Social History     Tobacco Use   ? Smoking status: Never Smoker   ? Smokeless tobacco: Never Used   Substance Use Topics   ? Alcohol use: Not Currently     Family History:  No family history on file.     Vitals:    Physical Exam:  General: Appears well-developed, well-nourished and close to stated age.   Head: Normocephalic, atraumatic.  Eyes: PERRL without icterus.   ENT: Oropharynx is clear, mucus membranes are moist.    CV: Regular in rate and rhythm, no murmurs or gallops.   Chest: Clear to auscultation bilaterally without wheezing or rhonchi.  Respiratory effort appears normal.   Abdomen: Soft, nontender and nondistended. Bowel sounds are present and normoactive. No organomegaly is appreciated  Musculoskeletal: No edema. No cyanosis. Extremities are warm and well-perfused.   Hematologic: No bruising, purpura or petechiae are noted.   Dermatologic: No rashes appreciated.   Lymphatic: No palpable cervical, supraclavicular, axillary or inguinal adenopathy appreciated.   Psychiatric: Affect appropriate.  Pleasant and conversant  Breast: B/L reconstruction appreciated    Recent Labs:  Lab Results   Component Value Date    NA 142 05/22/2019    K 3.7 05/22/2019    CL 105 05/22/2019    CO2 22 05/22/2019    CREAT 0.63 05/22/2019    BUN 11 05/22/2019    GLUCOSE 114 (H) 05/22/2019    CALCIUM 9.3 05/22/2019     Lab Results   Component Value Date    WBC 19.9 (HH) 05/22/2019    HGB 12.4 05/22/2019    HCT 36.7 05/22/2019    MCV 92.4 05/22/2019    PLT 359 05/22/2019 NEUTPCT 93.5 (H) 05/22/2019    NEUTABS 18.63 (H) 05/22/2019    MONOPCT 3.4 (L) 05/22/2019    MONOABS 0.67 05/22/2019     Lab Results   Component Value Date    TOTPRO 6.9 05/22/2019    ALBUMIN 4.7 05/22/2019    BILITOT 0.3 05/22/2019    AST 18 05/22/2019    ALT 22 05/22/2019    ALKPHOS 67 05/22/2019     12/13/18: CEA = 0.8, [place] 27.29 = 27    Pertinent Imaging:  Per HPI     Pertinent Pathology:  Per HPI     Impression and Recommendations:  KORIANNA WASHER is a 48 y.o. female with     1. Stage IIB pT2 (3.6 cm) cN1a (1/10 - 8  mm) cM0 RIGHT breast IDC, ER/PR > 95%, HER-2 FISH negative, Ki 67 25%, grade 2/3, SBR 7/9, +LVI, -PNI, and intermediate to high-grade DCIS, ultimately taken to clear margins with August 2020 nipple sparing mastectomy, RS= 16, here for cycle 2 adjuvant TC chemotherapy  2. Anxiety - stable  3. Myriad My Risk germline mutation analysis: negative  4. Leukocytosis, felt related to growth factor, no signs/sx infection - monitor    - again reviewed TC and peg filgastrim side effect profiles  - lowering peg filgastrim to 3 mg tomorrow  - adjuvant endocrine therapy following chemotherapy completion, will again review TAM vs OS/TAM vs OS/AI  - BCI moving forward to understand possible role of extended endocrine therapy  - for constipation for the first 4 days, start miralax full dose for the first 2 days then 1/2 dose the last 2 days  - for back pain- will obtain bone scan  - will refer patient to see Dr. Michae Kava for bigeminy for evaluation  Return to clinic:  No follow-ups on file.    Orders:  No orders of the defined types were placed in this encounter.      Attending Physician: Hardie Shackleton, MD.  Author: Carlyann Placide Gladis Riffle, MD

## 2019-06-12 NOTE — Telephone Encounter
NM Bone scan WB at [facility] 06/20/2019 @ 10AM    Patient aware ~ scheduled with Angelica    F [phone] [id number] ~ P [phone]    All forms to be FX as soon as AUTH is approved    Thank you

## 2019-06-12 NOTE — Telephone Encounter
Reply by: Truman Hayward  I called [facility], [facility] mentioned due to her INS she might  Get a better deal at [facility]. Are you ok with [facility]?

## 2019-06-12 NOTE — Telephone Encounter
Reply by: Truman Hayward  Patient has advised she would rather go to   [facility] since all previous has been done  there. I have scheduled for 06/20/2019 @ 10AM.    Patient aware  Thank you

## 2019-06-13 MED ADMIN — PEGFILGRASTIM-JMDB 6 MG/0.6ML SC SOSY: 3 mg | SUBCUTANEOUS | Stop: 2019-06-13

## 2019-06-16 ENCOUNTER — Ambulatory Visit: Payer: PRIVATE HEALTH INSURANCE

## 2019-06-17 ENCOUNTER — Ambulatory Visit: Payer: PRIVATE HEALTH INSURANCE | Attending: Hematology & Oncology

## 2019-06-20 ENCOUNTER — Ambulatory Visit: Payer: PRIVATE HEALTH INSURANCE

## 2019-06-20 ENCOUNTER — Telehealth: Payer: PRIVATE HEALTH INSURANCE

## 2019-06-20 NOTE — Telephone Encounter
Results Request - The patient would like to discuss the results of their recent tests.     1) Ordering MD: Dr. Lutricia Feil    2) What type of test(s)? Bone Scan    3) When was it performed? 11/20    4) Where was it performed? [place]    If [facility], are results available in [facility]? Not yet  If outside facility, what is their phone number?     Patient has been notified of the 24-48 hour turnaround time.      Pt was advised [facility] we should receive her results before the end of the day, hoping she can get a call back to find out the results before this weekend.    Thank you

## 2019-06-20 NOTE — Telephone Encounter
Do we have the results?

## 2019-06-20 NOTE — Telephone Encounter
Please let patient know, we will reach out to her after the report is here.

## 2019-06-20 NOTE — Telephone Encounter
Reply by: Aldona Lento  We dont have the results yet

## 2019-06-20 NOTE — Telephone Encounter
Forwarded by: Aldona Lento  Called pt, LM informing her we do not have the report yet and will reach out to her once we do, Dr will reach out to her

## 2019-06-30 NOTE — Telephone Encounter
Reply by: Aldona Lento  Scan report in your box-JB

## 2019-07-03 ENCOUNTER — Ambulatory Visit: Payer: PRIVATE HEALTH INSURANCE | Attending: Hematology & Oncology

## 2019-07-03 DIAGNOSIS — Z5111 Encounter for antineoplastic chemotherapy: Secondary | ICD-10-CM

## 2019-07-03 DIAGNOSIS — T451X5A Adverse effect of antineoplastic and immunosuppressive drugs, initial encounter: Secondary | ICD-10-CM

## 2019-07-03 DIAGNOSIS — Z170 Estrogen receptor positive status [ER+]: Secondary | ICD-10-CM

## 2019-07-03 DIAGNOSIS — C771 Secondary and unspecified malignant neoplasm of intrathoracic lymph nodes: Secondary | ICD-10-CM

## 2019-07-03 DIAGNOSIS — C773 Secondary and unspecified malignant neoplasm of axilla and upper limb lymph nodes: Secondary | ICD-10-CM

## 2019-07-03 DIAGNOSIS — C50911 Malignant neoplasm of unspecified site of right female breast: Secondary | ICD-10-CM

## 2019-07-03 DIAGNOSIS — R253 Fasciculation: Secondary | ICD-10-CM

## 2019-07-03 DIAGNOSIS — G620 Drug-induced polyneuropathy: Secondary | ICD-10-CM

## 2019-07-03 LAB — Magnesium: MAGNESIUM: 1.6 meq/L (ref 1.4–1.9)

## 2019-07-03 LAB — Comprehensive Metabolic Panel
ALANINE AMINOTRANSFERASE: 60 U/L (ref 8–64)
UREA NITROGEN: 9 mg/dL (ref 7–22)

## 2019-07-03 MED ADMIN — FAMOTIDINE 20 MG/2ML IV SOLN: 20 mg | INTRAVENOUS | @ 19:00:00 | Stop: 2019-07-03

## 2019-07-03 MED ADMIN — FOSAPREPITANT (EMEND) IVPB: 150 mg | INTRAVENOUS | @ 19:00:00 | Stop: 2019-07-03

## 2019-07-03 MED ADMIN — PALONOSETRON HCL 0.25 MG/5ML IV SOLN: .25 mg | INTRAVENOUS | @ 19:00:00 | Stop: 2019-07-03

## 2019-07-03 MED ADMIN — CYCLOPHOSPHAMIDE CHEMO INFUSION: 1000 mg | INTRAVENOUS | @ 22:00:00 | Stop: 2019-07-03

## 2019-07-03 MED ADMIN — DIPHENHYDRAMINE HCL 50 MG/ML IJ SOLN: 50 mg | INTRAVENOUS | @ 19:00:00 | Stop: 2019-07-03

## 2019-07-03 MED ADMIN — DEXAMETHASONE SODIUM PHOSPHATE 4 MG/ML IJ SOLN: 8 mg | INTRAVENOUS | @ 19:00:00 | Stop: 2019-07-03

## 2019-07-03 MED ADMIN — DOCETAXEL CHEMO INFUSION >100 MG: 124 mg | INTRAVENOUS | @ 22:00:00 | Stop: 2019-07-03

## 2019-07-03 MED ADMIN — DOCETAXEL CHEMO INFUSION >100 MG: 124 mg | INTRAVENOUS | @ 21:00:00 | Stop: 2019-07-03

## 2019-07-03 MED ADMIN — CYCLOPHOSPHAMIDE CHEMO INFUSION: 1000 mg | INTRAVENOUS | @ 21:00:00 | Stop: 2019-07-03

## 2019-07-03 NOTE — Nursing Note
C 4 d 1 Taxotere/Cytoxan. Pt did take decadron at home. Port accessed, cbc resulted here:   Lab Results   Component Value Date    WBC 16.1 (HH) 07/03/2019    HGB 11.2 07/03/2019    HCT 33.4 (L) 07/03/2019    MCV 94.6 07/03/2019    PLT 219 07/03/2019   ANC = 15.47. Dr Lutricia Feil notified. Cmp to RR. Cmp 11/12 meets parameters for treatment. Pt tolerated treatment well, left in stable condition. Will return for Fulphila injection tomorrow.

## 2019-07-03 NOTE — Progress Notes
Patient name:  Jenna Newman  MRN:  [id number]  DOB:  1970/09/11  CSN: [id number]  Date of encounter: 07/03/2019  Referring provider: No ref. provider found  PCP: Melanie Crazier, MD  Provider: Alric Seton. Zoila Shutter, MD      Subjective:  9 pre menopausal female with a history of menorrhagia status post laparoscopic bilateral tubal fulguration, hysteroscopy, dilatation and curettage, endometrial ablation December 2016, and grade 2, 3.6 cm right sided hormone positive, HER-2 negative, sentinel node positive (1/10 nodes +) ductal breast cancer, status post mastectomy followed by reconstruction as noted below, Oncotype RS = 16, here for cycle 4 adjuvant TC chemotherapy.     She had significant bone pain and obtained a nuclear medicine bone study at [facility], which was negative.     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 She has soft stools, questions if electrolyte derangement can be influencing muscle twitching.     Denies fevers, sweats, chills. No nausea or vomiting.     Plans for reconstruction Jan 2021.     ONCOLOGY HISTORY:   11/27/18 b/l dx tomosynthesis mammo at [facility]: heterogeneously dense breast tissue. Distortion in the RUO breast peri-areolar region without suspicious calcifications.   11/27/18 RIGHT breast US: 15 x 16 x 22 mm mass at 11:00, 6 cm from the nipple, with a 1.4 cm extension of the mass off the superior aspect appearing continuous by a thin bridge of tissue. No frank right axillary adenopathy  12/06/18 US guided right breast bx: IDC, 8 mm, grade 2/3, SBR 7/9, ER/PR > 95%, Ki 67 25%, HER-2 FISH negative, with background grade 3 DCIS, cribriform pattern. No LCIS, no LVI, no microcalcifications  12/25/18 Genetics consultation: Myriad My Risk germline negative. 12/25/18 MRI breasts at [facility]: 2.8 x 2.1 x 2.2 cm mass at 11:00 axi right breast middle to anterior depth 5 cm from nipple, no e/o multi focal or multi centric or contralateral disease. No e/o nodal or metastatic dz seen.   12/31/18 Dr. Shanda Howells Surgical Oncology consult  01/27/19 RIGHT lumpectomy/SLN bx and LEFT breast reduction mammoplasty:   RIGHT: 3.6 cm IDC, grade 2/3, MBR 6/9, +LVI, no PNI, with 1 of 2 sentinel nodes positive for 8 mm metastatic cancer, with 3.5 cm DCIS, intermediate grade, comedo-type, cribriform and solid, with focally + CIS margins at superior/medial, invasive carcinoma margins uninvolved  LEFT: benign, no in situ or invasive dz.   02/17/19 RIGHT breast re-excision: UDH, apocrine metaplasia, no residual invasive carcinoma, focal residual DCIS measuring 16 mm, 0.2 mm from new lateral margin.   03/17/19 RIGHT nipple sparing mastectomy and level 1/2 ax node dissection with reconstruction (Dr. Renaldo Reel): focal, residual 3 mm DCIS, 6 mm from closest margin, no residual invasive cancer, ADH free of margins by 1.3 cm, 8 axillary nodes removed, all negative.   05/01/19 - 07/03/19 4 cycles TC adjuvant chemotherapy  06/20/19 Bone scan at [facility] given complaints of bone pain: negative    ECOG performance status: 0    Objective:  Current medications:  Outpatient Medications Prior to Visit   Medication Sig   ? cetirizine 10 mg tablet Take 10 mg by mouth every other day.   ? LORazepam 1 mg tablet Take 1 tablet (1 mg total) by mouth every six (6) hours as needed (breakthrough nausea or anxiety). Max Daily Amount: 4 mg   ? ondansetron 8 mg tablet Take 1 tablet (8 mg total) by mouth every eight (8) hours as  needed for Nausea.   ? tamoxifen 20 mg tablet Take 1 tablet (20 mg total) by mouth daily.     No facility-administered medications prior to visit.        Allergies:  No Known Allergies    Review of Systems:  A 14 point review of systems was negative besides what I note in my HPI above. Past Medical History:  Past Medical History:   Diagnosis Date   ? Breast cancer (HCC/RAF)        Past Surgical History:  Past Surgical History:   Procedure Laterality Date   ? BREAST FIBROADENOMA SURGERY Bilateral 1992   ? BREAST LUMPECTOMY Right 12/2018   ? BREAST LUMPECTOMY Bilateral 01/27/2019   ? CESAREAN SECTION  2006 & 2008   ? CHOLECYSTECTOMY  2004   ? KNEE SURGERY Left 2017    Ligament release    ? MASTECTOMY Right 03/2019   ? TONSILECTOMY, ADENOIDECTOMY, BILATERAL MYRINGOTOMY AND TUBES  1983   ? TUBAL LIGATION  2016       Social History:  Social History     Tobacco Use   ? Smoking status: Never Smoker   ? Smokeless tobacco: Never Used   Substance Use Topics   ? Alcohol use: Not Currently     Family History:  History reviewed. No pertinent family history.     Vitals:  BP: 94/63 (12/03 0948)  Temp: 36.3 ?C (97.4 ?F) (12/03 0948)  Temp Source: Forehead (12/03 0948)  Heart Rate: 82 (12/03 0948)  Resp: 16 (12/03 0948)  SpO2: 97 % (12/03 0948)  Height: 160.9 cm (12/03 0948)  Weight: 61.7 kg (136 lb) (12/03 0948)    Physical Exam:  General: Appears well-developed, well-nourished and close to stated age.   Head: Normocephalic, atraumatic.  Eyes: PERRL without icterus.   ENT: Oropharynx is clear, mucus membranes are moist.    CV: Regular in rate and rhythm, no murmurs or gallops.   Chest: Clear to auscultation bilaterally without wheezing or rhonchi.  Respiratory effort appears normal.   Abdomen: Soft, nontender and nondistended. Bowel sounds are present and normoactive. No organomegaly is appreciated  Musculoskeletal: No edema. No cyanosis. Extremities are warm and well-perfused.   Hematologic: No bruising, purpura or petechiae are noted.   Dermatologic: No rashes appreciated.   Lymphatic: No palpable cervical, supraclavicular, axillary or inguinal adenopathy appreciated.   Psychiatric: Affect appropriate.  Pleasant and conversant  Breast: B/L reconstruction appreciated    Recent Labs:  Lab Results Component Value Date    NA 139 06/12/2019    K 4.0 06/12/2019    CL 106 06/12/2019    CO2 22 06/12/2019    CREAT 0.54 (L) 06/12/2019    BUN 9 06/12/2019    GLUCOSE 123 (H) 06/12/2019    CALCIUM 9.2 06/12/2019     Lab Results   Component Value Date    WBC 15.0 (H) 06/12/2019    HGB 11.8 06/12/2019    HCT 34.9 06/12/2019    MCV 91.8 06/12/2019    PLT 241 06/12/2019    NEUTPCT 95.4 (H) 06/12/2019    NEUTABS 14.27 (H) 06/12/2019    MONOPCT 2.1 (L) 06/12/2019    MONOABS 0.31 06/12/2019     Lab Results   Component Value Date    TOTPRO 6.5 06/12/2019    ALBUMIN 4.3 06/12/2019    BILITOT 0.4 06/12/2019    AST 31 06/12/2019    ALT 52 06/12/2019    ALKPHOS 74 06/12/2019  12/13/18: CEA = 0.8, [place] 27.29 = 27    Pertinent Imaging:  Per HPI     Pertinent Pathology:  Per HPI     Impression and Recommendations:  GERTHA LICHTENBERG is a 48 y.o. female with     1. Stage IIB pT2 (3.6 cm) cN1a (1/10 - 8 mm) cM0 RIGHT breast IDC, ER/PR > 95%, HER-2 FISH negative, Ki 67 25%, grade 2/3, SBR 7/9, +LVI, -PNI, and intermediate to high-grade DCIS, ultimately taken to clear margins with August 2020 nipple sparing mastectomy, RS= 16, here for her 4th and final cycle of adjuvant TC chemotherapy  2. Anxiety - improved  3. Myriad My Risk germline mutation analysis: negative  4. Leukocytosis, felt related to growth factor, no signs/sx infection - monitor  5. CIPN - grade 1  6. Muscle twitching - f/u CMP with mag today    - last cycle of TC as scheduled, peg-filgastrim tomorrow  - plan for reconstructive surgery next month  - return visit about 8 weeks, hope to initiate adjuvant endocrine treatment at that time  - reviewed role/rationale and toxicities of endocrine treatment  - reviewed improved outcomes of ovarian suppression with either tamoxifen or AI therapy vs tamoxifen alone in those premenopausal females treated with adjuvant chemotherapy, but given toxicity profile of ovarian suppression, we plan on implementing adjuvant tamoxifen first - reviewed diet and exercise data in context of breast cancer specific survival outcomes  - pending endocrine tolerance, will get BCI moving forward if extended endocrine therapy will be considered    Return to clinic:  Return in about 2 months (around 09/03/2019) for see MD.    Orders:  Orders Placed This Encounter   ? Magnesium       Alric Seton. Zoila Shutter, MD  Assistant Professor of Medicine  Division of Hematology/Oncology  Phone: [phone]  Fax: [phone]  Email: [email or web address]    Attending Physician: Alric Seton. Zoila Shutter, MD.  Author: Alric Seton. Zoila Shutter, MD

## 2019-07-04 ENCOUNTER — Ambulatory Visit: Payer: PRIVATE HEALTH INSURANCE

## 2019-07-04 MED ORDER — LORAZEPAM 1 MG PO TABS
1 mg | ORAL_TABLET | Freq: Four times a day (QID) | ORAL | 0 refills | Status: AC | PRN
Start: 2019-07-04 — End: ?

## 2019-07-05 MED ADMIN — PEGFILGRASTIM-JMDB 6 MG/0.6ML SC SOSY: 3 mg | SUBCUTANEOUS | Stop: 2019-07-04

## 2019-07-07 ENCOUNTER — Ambulatory Visit: Payer: PRIVATE HEALTH INSURANCE

## 2019-07-10 ENCOUNTER — Other Ambulatory Visit: Payer: Self-pay

## 2019-07-10 ENCOUNTER — Telehealth: Payer: PRIVATE HEALTH INSURANCE

## 2019-07-10 DIAGNOSIS — Z20822 Contact with and (suspected) exposure to covid-19: Secondary | ICD-10-CM

## 2019-07-10 NOTE — Telephone Encounter
Forwarded by: Aldona Lento  Done. Faxed note to [phone]

## 2019-07-10 NOTE — Telephone Encounter
Message to Practice/Provider    [id number]      Message: Radiation oncology would like to latest progress notes to be faxed over to  [phone]. Pls advise     Return call is not being requested by the patient or caller.    Patient or caller has been notified of the 24-48 hour processing turnaround time if applicable.

## 2019-07-13 LAB — NOVEL CORONAVIRUS, NAA: SARS-CoV-2, NAA: NOT DETECTED

## 2019-08-04 ENCOUNTER — Ambulatory Visit: Payer: PRIVATE HEALTH INSURANCE

## 2019-08-22 ENCOUNTER — Ambulatory Visit: Payer: PRIVATE HEALTH INSURANCE

## 2019-08-22 DIAGNOSIS — N951 Menopausal and female climacteric states: Secondary | ICD-10-CM

## 2019-08-22 DIAGNOSIS — R232 Flushing: Secondary | ICD-10-CM

## 2019-08-22 DIAGNOSIS — Z23 Encounter for immunization: Secondary | ICD-10-CM

## 2019-08-22 DIAGNOSIS — K2210 Ulcer of esophagus without bleeding: Secondary | ICD-10-CM

## 2019-08-22 DIAGNOSIS — F5101 Primary insomnia: Secondary | ICD-10-CM

## 2019-08-22 MED ORDER — GABAPENTIN 100 MG PO CAPS
100 mg | ORAL_CAPSULE | Freq: Every evening | ORAL | 1 refills | Status: AC | PRN
Start: 2019-08-22 — End: ?

## 2019-08-22 NOTE — Progress Notes
NEW PATIENT VISIT OUTPATIENT    CHIEF COMPLAINT:      Chief Complaint   Patient presents with   ? Establish Care       HISTORY OF PRESENT ILLNESS:      Jenna Newman is a 49 y.o. female with PMH as listed below presents to clinic history of breast CA s/p radiation, resection with planned reconstruction surgery. LMP 4 years ago following EMB. Past few month vasomotor symptoms, insomnia, hot flashes, trouble maintaining sleep.      PAST MEDICAL HISTORY:      Past Medical History:   Diagnosis Date   ? Breast cancer (HCC/RAF)        PAST SURGICAL HISTORY:      Past Surgical History:   Procedure Laterality Date   ? BREAST FIBROADENOMA SURGERY Bilateral 1992   ? BREAST LUMPECTOMY Right 12/2018   ? BREAST LUMPECTOMY Bilateral 01/27/2019   ? CESAREAN SECTION  2006 & 2008   ? CHOLECYSTECTOMY  2004   ? KNEE SURGERY Left 2017    Ligament release    ? MASTECTOMY Right 03/2019   ? TONSILECTOMY, ADENOIDECTOMY, BILATERAL MYRINGOTOMY AND TUBES  1983   ? TUBAL LIGATION  2016       CURRENT MEDICATIONS:      Current Outpatient Medications   Medication Sig   ? cetirizine 10 mg tablet Take 10 mg by mouth every other day.   ? LORazepam 1 mg tablet Take 1 tablet (1 mg total) by mouth every six (6) hours as needed (breakthrough nausea or anxiety). Max Daily Amount: 4 mg   ? diazePAM 5 mg tablet TAKE ONE TABLET BY MOUTH EVERY SIX HOURS AS NEEDED FOR MUSCLE TIGHTNESS   ? NUCYNTA 50 MG tablet TAKE ONE TABLET BY MOUTH EVERY SIX HOURS AS NEEDED FOR PAIN   ? ondansetron 8 mg tablet Take 1 tablet (8 mg total) by mouth every eight (8) hours as needed for Nausea. (Patient not taking: Reported on 08/22/2019.)   ? tamoxifen 20 mg tablet Take 1 tablet (20 mg total) by mouth daily. (Patient not taking: Reported on 08/22/2019.)     No current facility-administered medications for this visit.        ALLERGIES:      No Known Allergies    FAMILY HISTORY:      family history is not on file.    SOCIAL HISTORY: reports that she has never smoked. She has never used smokeless tobacco. She reports previous alcohol use. She reports that she does not use drugs.    REVIEW OF SYSTEMS:      General Health: Denies fever/chills, night sweats, weight changes, exercise intolerance    HEENT: Denies headache, changes in vision, diploplia, changes in hearing, tinnitus, vertigo, epistaxis, sore throat    Respiratory: Denies dyspnea, pleuritic pain, cough, wheezing, hemoptysis, cyanosis, snoring/apnea    Cardiovascular: Denies chest pain, DOE, orthopnea, PND, edema, palpitations, claudication, leg cramps    Gastrointestinal: Denies changes in appetite, odynophagia, dysphagia, heartburn, nausea/vomiting, hematemesis, jaundice, abdominal pain, hematochezia, melena, diarrhea, constipation    Genitourinary: Denies dysuria, nocturia, hematuria, frequency, hesitancy, urgency, urinary incontinence, urethral discharge, dyspareunia    Neurologic: Denies dizziness, syncope, coordination loss, paralysis, motor weakness, seizures, paresthesias    Extremities: Denies joint pain, swelling, stiffness, myalgias, deformities    Endocrine: Denies polyuria, polyphagia, heat/cold intolerance    Psych: Denies depression, sadness, sleep disturbances, anorexia, anhedonia, anxiety    PHYSICAL EXAMINATION:      Vital Signs:   Last  Recorded Vital Signs:    08/22/19 0941   BP: 116/62   Pulse: 73   Resp: 16   Temp: 36.1 ?C (97 ?F)   SpO2: 96%     GENERAL: AAO X 3, NAD    SKIN: intact with no suspicious lesions or rashes noted  HEENT: NC/AT, EOMI  NECK: supple, no JVD  LUNGS: non labored breathing.  HEART: Peripheral perfusion intact.   ABDOMEN: soft non-distended  EXTREMITIES: Extremities equal and symmetric. No clubbing/cyanosis/edema noted.   NEUROLOGIC: Fluent speech without gross CN abnormality. Moves extremities symmetrically.       DIAGNOSTIC STUDIES:      none    RADIOGRAPHIC STUDIES:      none    ASSESSMENT & PLAN:      1. Perimenopausal - gabapentin 100 mg capsule; Take 1 capsule (100 mg total) by mouth at bedtime as needed (1-3 pills as needed for sleep).  Dispense: 30 capsule; Refill: 1  - Estradiol; Future  - FSH; Future  - Hgb A1c; Future  - LH; Future  - Lipid Panel; Future  - Estradiol  - FSH  - Hgb A1c  - LH  - Lipid Panel    2. Hot flashes  Try Vit E black cohosh  - Vitamin D,25-Hydroxy; Future  - TSH with reflex FT4, FT3; Future  - Vitamin D,25-Hydroxy  - TSH with reflex FT4, FT3    3. Primary insomnia  - gabapentin 100 mg capsule; Take 1 capsule (100 mg total) by mouth at bedtime as needed (1-3 pills as needed for sleep).  Dispense: 30 capsule; Refill: 1    4. Menopausal vasomotor syndrome  - gabapentin 100 mg capsule; Take 1 capsule (100 mg total) by mouth at bedtime as needed (1-3 pills as needed for sleep).  Dispense: 30 capsule; Refill: 1    5. Need for immunization against influenza  - Influenza vaccine IM;   PF      The above plan of care, diagnoses, orders, and follow-up were discussed with the patient. Questions related to this recommended plan of care were answered.    Jacqulyn Liner, DO 08/22/2019  10:11 AM

## 2019-08-22 NOTE — Patient Instructions
Black Cohosh  Vit E  Gabapentin 1-3 pills at needed for sleep

## 2019-08-23 ENCOUNTER — Ambulatory Visit: Payer: PRIVATE HEALTH INSURANCE

## 2019-08-25 ENCOUNTER — Ambulatory Visit: Payer: PRIVATE HEALTH INSURANCE

## 2019-08-28 ENCOUNTER — Ambulatory Visit: Payer: PRIVATE HEALTH INSURANCE

## 2019-08-28 NOTE — Progress Notes
Outpatient Cardiology Consultation    PATIENT: Jenna Newman  MRN: [id number]  DOB: November 02, 1970  DATE OF SERVICE: 08/28/2019      PRIMARY CARE PROVIDER: Melanie Crazier, MD  REFERRING PHYSICIAN: Lamount Cranker, MD      REASON FOR CONSULTATION:   Chief Complaint   Patient presents with   ? New Consult   ? Palpitations     Bigeminy per patient    ? Syncope     2015       HISTORY OF PRESENT ILLNESS:  CAIDENCE KASEMAN is a 49 y.o. 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, has mostly subsided  -Reconstruction breast surgery planned  -No XRT  -plan is to start tamoxifen after surgery  -(-)DM  -(-)HTN  -(-)HLD, lipids not checked for a while  -(+)2 pregnancies, no complications  -(-)smoker  -(+)Mother with some congenital heart abnormality; father with carotid stenting  -5 years ago was having some syncope/chest pain, would have low heart rates into the 30s, responsible for syncope  -has had bigeminy  -had a stress test at some point 5 years ago  -has surgery on Friday  -pt with elevated liver enzymes and c/o abdominal bloating and RUQ AP      PAST MEDICAL HISTORY:  Past Medical History:   Diagnosis Date   ? Breast cancer (HCC/RAF)        PAST SURGICAL HISTORY:  Past Surgical History:   Procedure Laterality Date   ? BREAST FIBROADENOMA SURGERY Bilateral 1992   ? BREAST LUMPECTOMY Right 12/2018   ? BREAST LUMPECTOMY Bilateral 01/27/2019   ? CESAREAN SECTION  2006 & 2008   ? CHOLECYSTECTOMY  2004   ? KNEE SURGERY Left 2017    Ligament release    ? MASTECTOMY Right 03/2019   ? TONSILECTOMY, ADENOIDECTOMY, BILATERAL MYRINGOTOMY AND TUBES  1983   ? TUBAL LIGATION  2016       OUTPATIENT MEDICATIONS:  Current Outpatient Medications   Medication Sig ? gabapentin 100 mg capsule Take 1 capsule (100 mg total) by mouth at bedtime as needed (1-3 pills as needed for sleep).   ? tamoxifen 20 mg tablet Take 1 tablet (20 mg total) by mouth daily.     No current facility-administered medications for this visit.        ALLERGIES:  No Known Allergies    SOCIAL HISTORY:  Social History     Socioeconomic History   ? Marital status: Unknown     Spouse name: Not on file   ? Number of children: Not on file   ? Years of education: Not on file   ? Highest education level: Not on file   Occupational History   ? Not on file   Social Needs   ? Financial resource strain: Not on file   ? Food insecurity     Worry: Not on file     Inability: Not on file   ? Transportation needs     Medical: Not on file     Non-medical: Not on file   Tobacco Use   ? Smoking status: Never Smoker   ? Smokeless tobacco: Never Used   Substance and Sexual Activity   ? Alcohol use: Not Currently   ? Drug use: Never   ? Sexual activity: Not on  file   Lifestyle   ? Physical activity     Days per week: Not on file     Minutes per session: Not on file   ? Stress: Not on file   Relationships   ? Social Wellsite geologist on phone: Not on file     Gets together: Not on file     Attends religious service: Not on file     Active member of club or organization: Not on file     Attends meetings of clubs or organizations: Not on file     Relationship status: Not on file   Other Topics Concern   ? Do you exercise at least a day, 3 or more days a week? Not Asked   ? Types of Exercise? (List in Comments) Not Asked   ? Do you follow a special diet? Not Asked   ? Vegan? Not Asked   ? Vegetarian? Not Asked   ? Pescatarian? Not Asked   ? Lactose Free? Not Asked   ? Gluten Free? Not Asked   ? Omnivore? Not Asked   Social History Narrative   ? Not on file       FAMILY HISTORY:  No family history on file.    REVIEW OF SYSTEMS:  {FINDINGS ROS COMPLETE:30496}    PHYSICAL EXAMINATION: VITALS: BP 100/60 (Patient Position: Standing)  ~ Pulse 78  ~ Temp 36.1 ?C (96.9 ?F) (Forehead)  ~ Resp 18  ~ Ht 5' 3'' (1.6 m)  ~ Wt 135 lb (61.2 kg)  ~ SpO2 99%  ~ BMI 23.91 kg/m?      Orthostatic Values     First Set Last Set   Heart Rate: Heart Rate: 62 (08/28/19 1113) Heart Rate: 78 (08/28/19 1114)   Blood Pressure: BP: 118/70 (08/28/19 1113) BP: 100/60 (08/28/19 1114)   Position: Patient Position: Lying (08/28/19 1113) Patient Position: Standing (08/28/19 1114)   BP Location:       Cuff Size:       Comments:         General: well developed well nourished {Desc; female/female:11659} in no acute distress  HEENT: extraocular movements intact, anicteric, oropharynx clear, moist mucous membranes  Neck: no jugular venous distention. No lymphadenopathy  Heart: regular rate and rhythm, normal S1, S2. no rubs, murmurs, or gallops  Lungs: clear to auscultation bilaterally, no retractions  Abd: soft, non tender, non distended, normoactive bowel sounds, no hepatosplenomegaly  Ext: no clubbing, cyanosis, or edema  Skin: warm, dry , and well perfused  Neuro: grossly non focal        LABORATORY:  Lab Results   Component Value Date    WBC 16.1 (HH) 07/03/2019    HGB 11.2 07/03/2019    HCT 33.4 (L) 07/03/2019    MCV 94.6 07/03/2019    PLT 219 07/03/2019      Lab Results   Component Value Date    NA 142 07/03/2019    K 4.4 07/03/2019    CL 105 07/03/2019    CO2 23 07/03/2019    CREAT 0.53 (L) 07/03/2019    BUN 9 07/03/2019    GLUCOSE 152 (H) 07/03/2019      Lab Results   Component Value Date    HGBA1C 4.6 08/26/2019      Lab Results   Component Value Date    ALT 60 07/03/2019    AST 38 07/03/2019    ALKPHOS 67 07/03/2019    BILITOT 0.2 07/03/2019  No results found for: TSH   Lab Results   Component Value Date    CALCIUM 9.1 07/03/2019    No results found for: CHOL, CHOLHDL, CHOLDLCAL, CHOLDLQ, TRIGLY  No results found for: TROPONIN, BNP      DIAGNOSTIC STUDIES:  ECG  -***:    Echocardiogram  -***         ASSESSMENT: DARIEN MIGNOGNA is a 49 y.o. female with:  ***    Problem List Items Addressed This Visit     None      Visit Diagnoses     Palpitations    -  Primary    Relevant Orders    ECG 12-Lead Clinic Performed (Completed)          PLAN:  ***      The above plan of care, diagnosis, orders, and follow-up were discussed with the patient.  Questions related to this recommended plan of care were answered.    Thank you Dr. Marland Kitchen for allowing me to participate in the care of your patient.    Lamount Cranker, MD   Assistant Clinical Professor  Non-invasive Cardiology  [phone]  [place], [place] [address]  O: [phone]  F: [phone]  Email: [email or web address]    Author: Lamount Cranker, MD 08/28/2019 11:46 AM

## 2019-09-03 ENCOUNTER — Ambulatory Visit: Payer: PRIVATE HEALTH INSURANCE | Attending: Hematology & Oncology

## 2019-09-11 DIAGNOSIS — T451X5A Adverse effect of antineoplastic and immunosuppressive drugs, initial encounter: Secondary | ICD-10-CM

## 2019-09-11 DIAGNOSIS — C773 Secondary and unspecified malignant neoplasm of axilla and upper limb lymph nodes: Secondary | ICD-10-CM

## 2019-09-11 DIAGNOSIS — G620 Drug-induced polyneuropathy: Secondary | ICD-10-CM

## 2019-09-11 NOTE — Progress Notes
Patient name:  Jenna Newman  MRN:  [id number]  DOB:  [birth year]  CSN: [id number]  Date of encounter: 09/12/2019    Referring provider: No ref. provider found  PCP: Melanie Crazier, MD  Provider: Hilbert Corrigan. 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     Subjective/Pertinent Events:  07/03/2019: s/p cycle#4 of adjuvant TC. Had a f/u with Dr. Zoila Shutter that day. Before the visit, she had significant bone pain and had a negative bone scan at [facility]. 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 She had soft stools, questions if electrolyte derangement can be influencing muscle twitching. Denies fevers, sweats, chills. No nausea or vomiting.     08/22/2019: she had a f/u with Dr. Barnetta Chapel.     08/28/2019: she had a visit with Dr. Michae Kava. No contraindication to Tamoxifen from Dr. Lemar Lofty perspective      ONCOLOGY HISTORY:   11/27/18 b/l dx tomosynthesis mammo at [facility]: heterogeneously dense breast tissue. Distortion in the RUO breast peri-areolar region without suspicious calcifications.   11/27/18 RIGHT breast US: 15 x 16 x 22 mm mass at 11:00, 6 cm from the nipple, with a 1.4 cm extension of the mass off the superior aspect appearing continuous by a thin bridge of tissue. No frank right axillary adenopathy  12/06/18 US guided right breast bx: IDC, 8 mm, grade 2/3, SBR 7/9, ER/PR > 95%, Ki 67 25%, HER-2 FISH negative, with background grade 3 DCIS, cribriform pattern. No LCIS, no LVI, no microcalcifications 12/25/18 Genetics consultation: [facility] My Risk germline negative.   12/25/18 MRI breasts at [facility]: 2.8 x 2.1 x 2.2 cm mass at 11:00 axi right breast middle to anterior depth 5 cm from nipple, no e/o multi focal or multi centric or contralateral disease. No e/o nodal or metastatic dz seen.   12/31/18 Dr. Shanda Howells Surgical Oncology consult  01/27/19 RIGHT lumpectomy/SLN bx and LEFT breast reduction mammoplasty:   RIGHT: 3.6 cm IDC, grade 2/3, MBR 6/9, +LVI, no PNI, with 1 of 2 sentinel nodes positive for 8 mm metastatic cancer, with 3.5 cm DCIS, intermediate grade, comedo-type, cribriform and solid, with focally + CIS margins at superior/medial, invasive carcinoma margins uninvolved  LEFT: benign, no in situ or invasive dz.   02/17/19 RIGHT breast re-excision: UDH, apocrine metaplasia, no residual invasive carcinoma, focal residual DCIS measuring 16 mm, 0.2 mm from new lateral margin.   03/17/19 RIGHT nipple sparing mastectomy and level 1/2 ax node dissection with reconstruction (Dr. Renaldo Reel): focal, residual 3 mm DCIS, 6 mm from closest margin, no residual invasive cancer, ADH free of margins by 1.3 cm, 8 axillary nodes removed, all negative.   05/01/19 - 07/03/19 4 cycles TC adjuvant chemotherapy  06/20/19 Bone scan at [facility] given complaints of bone pain: negative    ECOG performance status: 0    Objective:  Current medications:  Outpatient Medications Prior to Visit   Medication Sig   ? gabapentin 100 mg capsule Take 1 capsule (100 mg total) by mouth at bedtime as needed (1-3 pills as needed for sleep).   ? tamoxifen 20 mg tablet Take 1  tablet (20 mg total) by mouth daily.     No facility-administered medications prior to visit.        Allergies:  No Known Allergies    Review of Systems:  A 14 point review of systems was negative besides what I note in my HPI above.     Past Medical History:  Past Medical History:   Diagnosis Date   ? Breast cancer (HCC/RAF)        Past Surgical History: Past Surgical History:   Procedure Laterality Date   ? BREAST FIBROADENOMA SURGERY Bilateral 1992   ? BREAST LUMPECTOMY Right 12/2018   ? BREAST LUMPECTOMY Bilateral 01/27/2019   ? CESAREAN SECTION  2006 & 2008   ? CHOLECYSTECTOMY  2004   ? KNEE SURGERY Left 2017    Ligament release    ? MASTECTOMY Right 03/2019   ? TONSILECTOMY, ADENOIDECTOMY, BILATERAL MYRINGOTOMY AND TUBES  1983   ? TUBAL LIGATION  2016       Social History:  Social History     Tobacco Use   ? Smoking status: Never Smoker   ? Smokeless tobacco: Never Used   Substance Use Topics   ? Alcohol use: Not Currently     Family History:  No family history on file.     Vitals:       Physical Exam:  General: Appears well-developed, well-nourished and close to stated age.   Head: Normocephalic, atraumatic.  Eyes: PERRL without icterus.   CV: Regular in rate and rhythm, no murmurs or gallops.   Chest: Clear to auscultation bilaterally without wheezing or rhonchi.  Respiratory effort appears normal.   Abdomen: Soft, nontender and nondistended. Bowel sounds are present and normoactive. No organomegaly is appreciated  Musculoskeletal: No edema. No cyanosis. Extremities are warm and well-perfused.   Hematologic: No bruising, purpura or petechiae are noted.   Dermatologic: No rashes appreciated.   Lymphatic: No palpable cervical, supraclavicular, axillary adenopathy appreciated.   Psychiatric: Affect appropriate.  Pleasant and conversant  Breast: B/L reconstruction appreciated    Recent Labs:     Ref. Range 05/22/2019 10:24 06/12/2019 10:10 07/03/2019 10:12   White Blood Cell Count Latest Ref Range: 4.0 - 10.0 10?/uL 19.9 (HH) 15.0 (H) 16.1 (HH)   Hemoglobin Latest Ref Range: 11.2 - 15.7 g/dL 29.5 62.1 30.8   Hematocrit Latest Ref Range: 34.1 - 44.9 % 36.7 34.9 33.4 (L)   Red Blood Cell Count Latest Ref Range: 3.9 - 6.1 x10E6/uL 4.0 3.8 (L) 3.5 (L)   Mean Corpuscular Volume Latest Ref Range: 79.0 - 94.8 fL 92.4 91.8 94.6 Mean Corpuscular Hemoglobin Latest Ref Range: 25.6 - 32.2 pg 31.2 31.1 31.7   MCH Concentration Latest Ref Range: 32.2 - 36.5 g/dL 65.7 84.6 96.2   Red Cell Distribution Width-CV Latest Ref Range: 11.6 - 14.4 % 12.0 12.5 13.8   Platelet Count, Auto Latest Ref Range: 163 - 369 10?/uL 359 241 219   Mean Platelet Volume Latest Ref Range: 9.4 - 12.4 fL 8.9 (L) 9.0 (L) 9.8   Neutrophil Percent, Auto Latest Ref Range: 34.0 - 71.1 % 93.5 (H) 95.4 (H) 96.1 (H)   Lymphocyte Percent, Auto Latest Ref Range: 19.3 - 53.1 % 2.9 (L) 2.4 (L) 1.4 (L)   Monocyte Percent, Auto Latest Ref Range: 4.7 - 12.5 % 3.4 (L) 2.1 (L) 2.3 (L)   Eosinophil Percent, Auto Latest Ref Range: 0.7 - 7.0 % 0.0 (L) 0.0 (L) 0.0 (L)   Basophil Percent, Auto Latest Ref Range:  0.1 - 1.2 % 0.2 0.1 0.2   Absolute Neut Count Latest Ref Range: 1.56 - 6.13 10?/uL 18.63 (H) 14.27 (H) 15.47 (H)   Absolute Mono Count Latest Ref Range: 0.24 - 0.86 10?/uL 0.67 0.31 0.37   Absolute Lymphocyte Count Latest Ref Range: 1.18 - 3.74 10?/uL 0.57 (L) 0.36 (L) 0.23 (L)   Absolute Baso Count Latest Ref Range: 0.01 - 0.08 10?/uL 0.04 0.02 0.04   Absolute Eos Count Latest Ref Range: 0.04 - 0.54 10?/uL 0.00 (L) 0.00 (L) 0.00 (L)        Ref. Range 05/22/2019 10:24 06/12/2019 10:10 07/03/2019 10:09   Sodium Latest Ref Range: 135 - 146 mmol/L 142 139 142   Potassium Latest Ref Range: 3.6 - 5.3 mmol/L 3.7 4.0 4.4   Chloride Latest Ref Range: 96 - 106 mmol/L 105 106 105   Total CO2 Latest Ref Range: 20 - 30 mmol/L 22 22 23    Anion Gap Latest Ref Range: 8 - 19 mmol/L 15 11 14    Urea Nitrogen Latest Ref Range: 7 - 22 mg/dL 11 9 9    Creatinine Latest Ref Range: 0.60 - 1.30 mg/dL 1.61 0.96 (L) 0.45 (L)   Glucose Latest Ref Range: 65 - 99 mg/dL 409 (H) 811 (H) 914 (H)   Calcium Latest Ref Range: 8.6 - 10.4 mg/dL 9.3 9.2 9.1   Magnesium Latest Ref Range: 1.4 - 1.9 mEq/L   1.6   AST (SGOT) Latest Ref Range: 13 - 47 U/L 18 31 38   ALT (SGPT) Latest Ref Range: 8 - 64 U/L 22 52 60 Alkaline Phosphatase Latest Ref Range: 37 - 113 U/L 67 74 67   Bilirubin,Total Latest Ref Range: 0.1 - 1.2 mg/dL 0.3 0.4 0.2   Albumin Latest Ref Range: 3.9 - 5.0 g/dL 4.7 4.3 4.1   TOTAL PROTEIN Latest Ref Range: 6.1 - 8.2 g/dL 6.9 6.5 6.2       7/82/95: CEA = 0.8, [place] 27.29 = 27    Pertinent Imaging:  Per HPI     Pertinent Pathology:  Per HPI     Impression and Recommendations:  Jenna Newman is a 49 y.o. female with           Return to clinic:  No follow-ups on file.    Orders:  No orders of the defined types were placed in this encounter.      Attending Physician: Hilbert Corrigan. Konrad Felix, MD.  Author: Hilbert Corrigan. Konrad Felix, MD    Chart review only. Patient has not been seen yet

## 2019-09-12 ENCOUNTER — Ambulatory Visit: Payer: PRIVATE HEALTH INSURANCE | Attending: Hematology & Oncology

## 2019-09-12 ENCOUNTER — Ambulatory Visit: Payer: PRIVATE HEALTH INSURANCE

## 2019-09-12 DIAGNOSIS — R7401 Transaminitis: Secondary | ICD-10-CM

## 2019-09-12 LAB — CBC: RED BLOOD CELL COUNT: 4.56 x10E6/uL (ref 3.96–5.09)

## 2019-09-12 LAB — Comprehensive Metabolic Panel
ASPARTATE AMINOTRANSFERASE: 74 U/L — ABNORMAL HIGH (ref 13–47)
CREATININE: 0.59 mg/dL — ABNORMAL LOW (ref 0.60–1.30)

## 2019-09-12 LAB — Differential Automated: ABSOLUTE LYMPHOCYTE COUNT: 0.87 10*3/uL — ABNORMAL LOW (ref 1.30–3.40)

## 2019-09-13 ENCOUNTER — Ambulatory Visit: Payer: PRIVATE HEALTH INSURANCE

## 2019-09-13 DIAGNOSIS — R7401 Transaminitis: Secondary | ICD-10-CM

## 2019-09-13 DIAGNOSIS — C773 Secondary and unspecified malignant neoplasm of axilla and upper limb lymph nodes: Secondary | ICD-10-CM

## 2019-09-14 LAB — Gamma Glutamyl Transferase: GAMMA GLUTAMYL TRANSFERASE: 42 U/L (ref 7–68)

## 2019-09-16 ENCOUNTER — Ambulatory Visit: Payer: PRIVATE HEALTH INSURANCE

## 2019-09-16 ENCOUNTER — Telehealth: Payer: PRIVATE HEALTH INSURANCE

## 2019-09-16 NOTE — Telephone Encounter
CT CAP @ [facility] 2/25 at 3:15pm     Scheduled with Margreta Journey    All forms faxed  2/16    PT aware    [place]: [phone]   ~   [place]: [phone]

## 2019-09-19 ENCOUNTER — Ambulatory Visit: Payer: PRIVATE HEALTH INSURANCE

## 2019-09-20 ENCOUNTER — Ambulatory Visit: Payer: PRIVATE HEALTH INSURANCE

## 2019-10-02 ENCOUNTER — Ambulatory Visit: Payer: PRIVATE HEALTH INSURANCE

## 2019-10-08 ENCOUNTER — Ambulatory Visit: Payer: PRIVATE HEALTH INSURANCE

## 2019-10-10 ENCOUNTER — Telehealth: Payer: PRIVATE HEALTH INSURANCE

## 2019-10-10 NOTE — Telephone Encounter
LMOVM asking pt to c/b and r/s appt 10/24/19 with Dr. Doyne Keel.

## 2019-10-23 ENCOUNTER — Ambulatory Visit: Payer: PRIVATE HEALTH INSURANCE

## 2019-10-24 ENCOUNTER — Ambulatory Visit: Payer: PRIVATE HEALTH INSURANCE

## 2019-10-29 DIAGNOSIS — C773 Secondary and unspecified malignant neoplasm of axilla and upper limb lymph nodes: Secondary | ICD-10-CM

## 2019-10-29 DIAGNOSIS — Z79810 Long term (current) use of selective estrogen receptor modulators (SERMs): Secondary | ICD-10-CM

## 2019-10-29 DIAGNOSIS — R7401 Transaminitis: Secondary | ICD-10-CM

## 2019-10-29 NOTE — Progress Notes
Patient name:  Jenna Newman  MRN:  [id number]  DOB:  [birth year]  CSN: [id number]  Date of encounter: 10/31/2019    Referring provider: No ref. provider found  PCP: Melanie Crazier, MD  Provider: Hilbert Corrigan. 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       07/03/2019: s/p cycle#4 of adjuvant TC. Had a f/u with Dr. Zoila Shutter that day. Before the visit, she had significant bone pain and had a negative bone scan at [facility]. 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 She had soft stools, questions if electrolyte derangement can be influencing muscle twitching. Denies fevers, sweats, chills. No nausea or vomiting.     08/22/2019: she had a f/u with Dr. Barnetta Chapel.     08/28/2019: she had a visit with Dr. Michae Kava. No contraindication to Tamoxifen from Dr. Lemar Lofty perspective    09/12/2019: had a f/u with me. Mild nausea/stomach discomfort. Seeing GI soon. Pepcid helped. +mild neuropathy in her fingers. +vasomotor symptoms including hot flashes. Noted AST 74 and ALT 106. Asked her not to start tamoxifen yet. Ordered CT as well to r/o liver lesions    Subjective/Pertinent Events:  09/19/2019: AST and ALT normalized. Acute hepatitis panel negative. She then told me she was convinced that it was tumeric that she was taking. She had stopped it after finding out about liver dysfunction.     09/25/2019: CT Chest: negative  09/25/2019: CT A/P: NED. Liver was normal.  09/30/2019: started on Tamoxifen   10/02/2019: TTE done    [facility] returns for a 1 month follow up after starting tamoxifen.     Nausea/stomach discomfort resolved  Hasn't had to take Pepcid    No headache, chest pain, SOB, coughing  No fever/chills  No abdominal pain  NO bleeding.   NO periods.     No significant SE from tamoxifen  She hasn't been able to sleep due to hot flashes/insomnia  She still has hot flashes    Has PAP scheduled  Mammogram left side scheduled        ONCOLOGY HISTORY:   11/27/18 b/l dx tomosynthesis mammo at [facility]: heterogeneously dense breast tissue. Distortion in the RUO breast peri-areolar region without suspicious calcifications.   11/27/18 RIGHT breast US: 15 x 16 x 22 mm mass at 11:00, 6 cm from the nipple, with a 1.4 cm extension of the mass off the superior aspect appearing continuous by a thin bridge of tissue. No frank right axillary adenopathy  12/06/18 US guided right breast bx: IDC, 8 mm, grade 2/3, SBR 7/9, ER/PR > 95%, Ki 67 25%, HER-2 FISH negative, with background grade 3 DCIS, cribriform pattern. No LCIS, no LVI, no microcalcifications  12/25/18 Genetics consultation: Myriad My Risk germline negative.   12/25/18 MRI breasts at [facility]: 2.8 x 2.1 x 2.2 cm mass at 11:00 axi right breast middle to anterior depth 5 cm from nipple, no e/o multi focal or multi centric or contralateral disease. No e/o nodal or metastatic dz seen.   12/31/18 Dr. Shanda Howells Surgical Oncology consult  01/27/19 RIGHT lumpectomy/SLN bx and LEFT breast reduction mammoplasty:  RIGHT: 3.6 cm IDC, grade 2/3, MBR 6/9, +LVI, no PNI, with 1 of 2 sentinel nodes positive for 8 mm metastatic cancer, with 3.5 cm DCIS, intermediate grade, comedo-type, cribriform and solid, with focally + CIS margins at superior/medial, invasive carcinoma margins uninvolved  LEFT: benign, no in situ or invasive dz.   02/17/19 RIGHT breast re-excision: UDH, apocrine metaplasia, no residual invasive carcinoma, focal residual DCIS measuring 16 mm, 0.2 mm from new lateral margin.   03/17/19 RIGHT nipple sparing mastectomy and level 1/2 ax node dissection with reconstruction (Dr. Renaldo Reel): focal, residual 3 mm DCIS, 6 mm from closest margin, no residual invasive cancer, ADH free of margins by 1.3 cm, 8 axillary nodes removed, all negative.   05/01/19 - 07/03/19 4 cycles TC adjuvant chemotherapy  06/20/19 Bone scan at [facility] given complaints of bone pain: negative  09/25/2019: CT Chest: negative  09/25/2019: CT A/P: NED. Liver was normal.    ECOG performance status: 0      Objective:  Current medications:  Outpatient Medications Prior to Visit   Medication Sig   ? tamoxifen 20 mg tablet Take 1 tablet (20 mg total) by mouth daily.   ? gabapentin 100 mg capsule Take 1 capsule (100 mg total) by mouth at bedtime as needed (1-3 pills as needed for sleep).     No facility-administered medications prior to visit.        Allergies:  No Known Allergies    Review of Systems:  A 14 point review of systems was negative besides what I note in my HPI above.     Past Medical History:  Past Medical History:   Diagnosis Date   ? Breast cancer (HCC/RAF)        Past Surgical History:  Past Surgical History:   Procedure Laterality Date   ? BREAST FIBROADENOMA SURGERY Bilateral 1992   ? BREAST LUMPECTOMY Right 12/2018   ? BREAST LUMPECTOMY Bilateral 01/27/2019   ? CESAREAN SECTION  2006 & 2008   ? CHOLECYSTECTOMY  2004   ? KNEE SURGERY Left 2017    Ligament release    ? MASTECTOMY Right 03/2019   ? TONSILECTOMY, ADENOIDECTOMY, BILATERAL MYRINGOTOMY AND TUBES  1983   ? TUBAL LIGATION  2016       Social History:  Social History     Tobacco Use   ? Smoking status: Never Smoker   ? Smokeless tobacco: Never Used   Substance Use Topics   ? Alcohol use: Not Currently     Family History:  No family history on file.     Vitals:  BP: 109/75 (04/02 1311)  Temp: 36.3 ?C (97.3 ?F) (04/02 1311)  Temp Source: Oral (04/02 1311)  Heart Rate: 71 (04/02 1311)  Resp: 16 (04/02 1311)  SpO2: 96 % (04/02 1311)  Height: --  Weight: 61.4 kg (135 lb 6.4 oz) (04/02 1311)    Physical Exam:  General: Appears well-developed, well-nourished and close to stated age.   Head: Normocephalic, atraumatic.  Eyes: PERRL without icterus.   CV: Regular in rate and rhythm, no murmurs or gallops.   Chest: Clear to auscultation bilaterally without wheezing or rhonchi.  Respiratory effort appears normal.   Abdomen: Soft, nontender and nondistended. Bowel sounds are present and normoactive. No organomegaly is appreciated  Musculoskeletal: No edema. No cyanosis. Extremities are warm and well-perfused.   Hematologic: No bruising, purpura or petechiae are noted.   Dermatologic: No rashes appreciated.   Lymphatic: No palpable cervical, supraclavicular adenopathy appreciated.  Psychiatric: Affect appropriate.  Pleasant and conversant  Breast: deferred    Recent Labs:     Ref. Range 07/03/2019 10:12 09/12/2019 11:08   White Blood Cell Count Latest Ref Range: 4.16 - 9.95 x10E3/uL 16.1 (HH) 5.55   Hemoglobin Latest Ref Range: 11.6 - 15.2 g/dL 04.5 40.9   Hematocrit Latest Ref Range: 34.9 - 45.2 % 33.4 (L) 42.5   Red Blood Cell Count Latest Ref Range: 3.96 - 5.09 x10E6/uL 3.5 (L) 4.56   Mean Corpuscular Volume Latest Ref Range: 79.3 - 98.6 fL 94.6 93.2   Mean Corpuscular Hemoglobin Latest Ref Range: 26.4 - 33.4 pg 31.7 30.7   MCH Concentration Latest Ref Range: 31.5 - 35.5 g/dL 81.1 91.4   Red Cell Distribution Width-SD Latest Ref Range: 36.9 - 48.3 fL  38.7   Red Cell Distribution Width-CV Latest Ref Range: 11.1 - 15.5 % 13.8 11.5   Platelet Count, Auto Latest Ref Range: 143 - 398 x10E3/uL 219 265   Mean Platelet Volume Latest Ref Range: 9.3 - 13.0 fL 9.8 9.6   Nucleated RBC%, automated Latest Ref Range: No Ref. Range %  0.0   Absolute Nucleated RBC Count Latest Ref Range: 0.00 - 0.00 x10E3/uL  0.00   Neutrophil Percent, Auto Latest Ref Range: No Ref. Range % 96.1 (H) 60.1   Lymphocyte Percent, Auto Latest Ref Range: No Ref. Range % 1.4 (L) 15.7   Monocyte Percent, Auto Latest Ref Range: No Ref. Range % 2.3 (L) 7.2   Eosinophil Percent, Auto Latest Ref Range: No Ref. Range % 0.0 (L) 15.9   Basophil Percent, Auto Latest Ref Range: No Ref. Range % 0.2 0.7   Immature Granulocytes% Latest Ref Range: No Reference Range %  0.4   Absolute Neut Count Latest Ref Range: 1.80 - 6.90 x10E3/uL 15.47 (H) 3.34   Neutrophil Abs (Prelim) Latest Ref Range: See Absolute Neut Ct. x10E3/uL  3.34   Absolute Mono Count Latest Ref Range: 0.20 - 0.80 x10E3/uL 0.37 0.40   Absolute Lymphocyte Count Latest Ref Range: 1.30 - 3.40 x10E3/uL 0.23 (L) 0.87 (L)   Absolute Baso Count Latest Ref Range: 0.00 - 0.10 x10E3/uL 0.04 0.04   Absolute Eos Count Latest Ref Range: 0.00 - 0.50 x10E3/uL 0.00 (L) 0.88 (H)   Absolute Immature Gran Count Latest Ref Range: 0.00 - 0.04 x10E3/uL  0.02      Ref. Range 09/12/2019 11:08 09/19/2019 09:58   Sodium Latest Ref Range: 135 - 146 mmol/L 139    Potassium Latest Ref Range: 3.6 - 5.3 mmol/L 4.3    Chloride Latest Ref Range: 96 - 106 mmol/L 106    Total CO2 Latest Ref Range: 20 - 30 mmol/L 23    Anion Gap Latest Ref Range: 8 - 19 mmol/L 10    Urea Nitrogen Latest Ref Range: 7 - 22 mg/dL 10    Creatinine Latest Ref Range: 0.60 - 1.30 mg/dL 7.82 (L)    AST (SGOT) Latest Ref Range: 13 - 47 U/L 74 (H) 35   ALT (SGPT) Latest Ref Range: 8 - 64 U/L 106 (H) 63   GGT Latest Ref Range: 7 - 68 U/L 42    Alkaline Phosphatase Latest Ref Range: 37 - 113 U/L 97 88   Bilirubin,Conj Latest Ref Range: <=0.3 mg/dL  <9.5   Bilirubin,Total Latest Ref Range: 0.1 - 1.2 mg/dL 0.2 0.2   Albumin Latest Ref Range: 3.9 - 5.0 g/dL 4.1 4.2   TOTAL PROTEIN Latest Ref Range: 6.1 - 8.2 g/dL 6.2  6.3     12/13/18: CEA = 0.8, Ross 27.29 = 27    Pertinent Imaging:  Per HPI     Pertinent Pathology:  Per HPI     Impression and Recommendations:  Jenna Newman is a 49 y.o. female with     1. Stage IIB pT2 (3.6 cm) cN1a (1/10 - 8 mm) cM0 RIGHT breast IDC, ER/PR > 95%, HER-2 FISH negative, Ki 67 25%, grade 2/3, SBR 7/9, +LVI, -PNI, and intermediate to high-grade DCIS, ultimately taken to clear margins with August 2020 nipple sparing mastectomy, RS= 16. She is now s/p all 4 cycles of adjuvant TC chemotherapy. She is also s/p reconstruction surgery. Started on tamoxifen last month. Restaging CT CAP was negative in 09/2019  2. Anxiety - improved  3. Myriad My Risk germline mutation analysis: negative  4. Leukocytosis, felt related to growth factor, no signs/sx infection - resolved.  5. CIPN - grade 1  6. Transaminitis: self limited. Resolved. Possibly due to OTC turmeric. Acute hepatitis panel negative. Will repeat LFT as tamoxifen can sometimes cause transaminitis as well      - s/p 4 cycles of adjuvant TC  - reviewed staging CT CAP result  - Dr. Zoila Shutter had reviewed role/rationale and toxicities of endocrine treatment with Tamoxifen. I again discussed the option of ovarian suppression with AI therapy vs tamoxifen alone in those premenopausal females treated with adjuvant chemotherapy, but given toxicity profile of ovarian suppression, we plan on implementing adjuvant tamoxifen first. She has started tamoxifen  - reviewed diet and exercise data in context of breast cancer specific survival outcomes  - pending endocrine tolerance, will get BCI moving forward if extended endocrine therapy will be considered  - CBC, CMP today  - annual pelvic exam with PCP or OBGYN  - due for left mammogram end of this month, already scheduled  - avoid turmeric    Return to clinic:  RTC 3 months====================================================  Electronically signed by Marina Goodell, MD  Health Sciences Assistant Clinical Professor  [facility] Hematology and Oncology  Office: [phone] Fax: [phone]  10/31/2019 1:18 PM

## 2019-10-31 ENCOUNTER — Ambulatory Visit: Payer: PRIVATE HEALTH INSURANCE

## 2019-10-31 MED ORDER — TAMOXIFEN CITRATE 20 MG PO TABS
20 mg | ORAL_TABLET | Freq: Every day | ORAL | 0 refills | Status: AC
Start: 2019-10-31 — End: ?

## 2019-10-31 NOTE — Nursing Note
Collected peripheral labs from the left arm.

## 2019-11-01 ENCOUNTER — Ambulatory Visit: Payer: PRIVATE HEALTH INSURANCE

## 2019-11-01 LAB — Comprehensive Metabolic Panel
ALBUMIN: 4.4 g/dL (ref 3.9–5.0)
SODIUM: 143 mmol/L (ref 135–146)

## 2019-11-01 LAB — Differential Automated: MONOCYTE PERCENT, AUTO: 6.6 (ref 0.00–0.10)

## 2019-11-01 LAB — CBC: MEAN CORPUSCULAR VOLUME: 92.7 fL (ref 79.3–98.6)

## 2019-11-10 ENCOUNTER — Ambulatory Visit: Payer: PRIVATE HEALTH INSURANCE

## 2019-11-10 DIAGNOSIS — N951 Menopausal and female climacteric states: Secondary | ICD-10-CM

## 2019-11-10 DIAGNOSIS — F5101 Primary insomnia: Secondary | ICD-10-CM

## 2019-11-10 MED ORDER — GABAPENTIN 300 MG PO CAPS
300 mg | ORAL_CAPSULE | Freq: Every evening | ORAL | 3 refills | Status: AC | PRN
Start: 2019-11-10 — End: ?

## 2020-02-06 ENCOUNTER — Ambulatory Visit: Payer: PRIVATE HEALTH INSURANCE | Attending: Hematology & Oncology

## 2020-02-06 NOTE — Progress Notes
Patient name:  Jenna Newman  MRN:  [id number]  DOB:  02-Sep-1970  CSN: [id number]  Date of encounter: 02/06/2020  Referring provider: No ref. provider found  PCP: Jacqulyn Liner, DO  Provider: Alric Seton. 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     She had significant bone pain and obtained a nuclear medicine bone study at [facility], which was negative.     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 She has soft stools, questions if electrolyte derangement can be influencing muscle twitching.     Denies fevers, sweats, chills. No nausea or vomiting.     Plans for reconstruction Jan 2021.     ONCOLOGY HISTORY:   11/27/18 b/l dx tomosynthesis mammo at [facility]: heterogeneously dense breast tissue. Distortion in the RUO breast peri-areolar region without suspicious calcifications.   11/27/18 RIGHT breast US: 15 x 16 x 22 mm mass at 11:00, 6 cm from the nipple, with a 1.4 cm extension of the mass off the superior aspect appearing continuous by a thin bridge of tissue. No frank right axillary adenopathy  12/06/18 US guided right breast bx: IDC, 8 mm, grade 2/3, SBR 7/9, ER/PR > 95%, Ki 67 25%, HER-2 FISH negative, with background grade 3 DCIS, cribriform pattern. No LCIS, no LVI, no microcalcifications  12/25/18 Genetics consultation: Myriad My Risk germline negative.   12/25/18 MRI breasts at [facility]: 2.8 x 2.1 x 2.2 cm mass at 11:00 axi right breast middle to anterior depth 5 cm from nipple, no e/o multi focal or multi centric or contralateral disease. No e/o nodal or metastatic dz seen.   12/31/18 Dr. Shanda Howells Surgical Oncology consult  01/27/19 RIGHT lumpectomy/SLN bx and LEFT breast reduction mammoplasty:   RIGHT: 3.6 cm IDC, grade 2/3, MBR 6/9, +LVI, no PNI, with 1 of 2 sentinel nodes positive for 8 mm metastatic cancer, with 3.5 cm DCIS, intermediate grade, comedo-type, cribriform and solid, with focally + CIS margins at superior/medial, invasive carcinoma margins uninvolved  LEFT: benign, no in situ or invasive dz.   02/17/19 RIGHT breast re-excision: UDH, apocrine metaplasia, no residual invasive carcinoma, focal residual DCIS measuring 16 mm, 0.2 mm from new lateral margin.   03/17/19 RIGHT nipple sparing mastectomy and level 1/2 ax node dissection with reconstruction (Dr. Renaldo Reel): focal, residual 3 mm DCIS, 6 mm from closest margin, no residual invasive cancer, ADH free of margins by 1.3 cm, 8 axillary nodes removed, all negative.   05/01/19 - 07/03/19 4 cycles TC adjuvant chemotherapy  06/20/19 Bone scan at [facility] given complaints of bone pain: negative  11/14/19 LEFT mammogram: heterogeneously dense, benign - BIRADS2      ECOG performance status: 0    Objective:  Current medications:  Outpatient Medications Prior to Visit   Medication Sig   ??? gabapentin 300 mg capsule Take 1 capsule (300 mg total) by mouth at bedtime as needed (sleep).   ??? tamoxifen 20 mg tablet Take 1 tablet (20 mg total) by mouth daily.     No facility-administered medications prior to visit.  Allergies:  No Known Allergies    Review of Systems:  A 14 point review of systems was negative besides what I note in my HPI above.     Past Medical History:  Past Medical History:   Diagnosis Date   ??? Breast cancer (HCC/RAF)        Past Surgical History:  Past Surgical History:   Procedure Laterality Date   ??? BREAST FIBROADENOMA SURGERY Bilateral 1992   ??? BREAST LUMPECTOMY Right 12/2018   ??? BREAST LUMPECTOMY Bilateral 01/27/2019   ??? CESAREAN SECTION  2006 & 2008   ??? CHOLECYSTECTOMY  2004   ??? KNEE SURGERY Left 2017    Ligament release    ??? MASTECTOMY Right 03/2019   ??? TONSILECTOMY, ADENOIDECTOMY, BILATERAL MYRINGOTOMY AND TUBES  1983   ??? TUBAL LIGATION  2016       Social History:  Social History     Tobacco Use   ??? Smoking status: Never Smoker   ??? Smokeless tobacco: Never Used   Substance Use Topics   ??? Alcohol use: Not Currently     Family History:  No family history on file.     Vitals:  BP: --  Temp: --  Temp Source: --  Heart Rate: --  Resp: --  SpO2: --  Height: --  Weight: --    Physical Exam:  General: Appears well-developed, well-nourished and close to stated age.   Head: Normocephalic, atraumatic.  Eyes: PERRL without icterus.   ENT: Oropharynx is clear, mucus membranes are moist.    CV: Regular in rate and rhythm, no murmurs or gallops.   Chest: Clear to auscultation bilaterally without wheezing or rhonchi.  Respiratory effort appears normal.   Abdomen: Soft, nontender and nondistended. Bowel sounds are present and normoactive. No organomegaly is appreciated  Musculoskeletal: No edema. No cyanosis. Extremities are warm and well-perfused.   Hematologic: No bruising, purpura or petechiae are noted.   Dermatologic: No rashes appreciated.   Lymphatic: No palpable cervical, supraclavicular, axillary or inguinal adenopathy appreciated.   Psychiatric: Affect appropriate.  Pleasant and conversant  Breast: B/L reconstruction appreciated    Recent Labs:  Lab Results   Component Value Date    NA 143 10/31/2019    K 4.7 10/31/2019    CL 105 10/31/2019    CO2 28 10/31/2019    CREAT 0.78 10/31/2019    BUN 11 10/31/2019    GLUCOSE 107 (H) 10/31/2019    CALCIUM 9.4 10/31/2019    MG 1.6 07/03/2019     Lab Results   Component Value Date    WBC 5.46 10/31/2019    HGB 13.7 10/31/2019    HCT 43.3 10/31/2019    MCV 92.7 10/31/2019    PLT 253 10/31/2019    NEUTPCT 67.3 10/31/2019    NEUTABS 3.68 10/31/2019    MONOPCT 6.6 10/31/2019    MONOABS 0.36 10/31/2019     Lab Results   Component Value Date    TOTPRO 6.3 10/31/2019    ALBUMIN 4.4 10/31/2019    BILITOT 0.3 10/31/2019    BILICON <0.2 09/19/2019    AST 30 10/31/2019    ALT 25 10/31/2019    ALKPHOS 51 10/31/2019    GGT 42 09/12/2019     12/13/18: CEA = 0.8, [place] 27.29 = 27    Pertinent Imaging:  Per HPI     Pertinent Pathology:  Per HPI     Impression and Recommendations:  Jenna Newman is a 49 y.o. female with  1. Stage IIB pT2 (3.6 cm) cN1a (1/10 - 8 mm) cM0 RIGHT breast IDC, ER/PR > 95%, HER-2 FISH negative, Ki 67 25%, grade 2/3, SBR 7/9, +LVI, -PNI, and intermediate to high-grade DCIS, ultimately taken to clear margins with August 2020 nipple sparing mastectomy, RS= 16, here for her 4th and final cycle of adjuvant TC chemotherapy  2. Anxiety - improved  3. Myriad My Risk germline mutation analysis: negative  4. Leukocytosis, felt related to growth factor, no signs/sx infection - monitor  5. CIPN - grade 1  6. Muscle twitching - f/u CMP with mag today    - last cycle of TC as scheduled, peg-filgastrim tomorrow  - plan for reconstructive surgery next month  - return visit about 8 weeks, hope to initiate adjuvant endocrine treatment at that time  - reviewed role/rationale and toxicities of endocrine treatment  - reviewed improved outcomes of ovarian suppression with either tamoxifen or AI therapy vs tamoxifen alone in those premenopausal females treated with adjuvant chemotherapy, but given toxicity profile of ovarian suppression, we plan on implementing adjuvant tamoxifen first  - reviewed diet and exercise data in context of breast cancer specific survival outcomes  - pending endocrine tolerance, will get BCI moving forward if extended endocrine therapy will be considered    Return to clinic:  No follow-ups on file.    Orders:  No orders of the defined types were placed in this encounter.      Alric Seton. Zoila Shutter, MD  Assistant Professor of Medicine  Division of Hematology/Oncology  Phone: [phone]  Fax: [phone]  Email: [email or web address]    Attending Physician: Alric Seton. Zoila Shutter, MD.  Author: Alric Seton. Zoila Shutter, MD

## 2020-03-12 MED ORDER — GABAPENTIN 300 MG PO CAPS
300 mg | ORAL_CAPSULE | Freq: Every evening | ORAL | 0 refills | Status: AC | PRN
Start: 2020-03-12 — End: ?

## 2020-03-22 ENCOUNTER — Ambulatory Visit: Payer: PRIVATE HEALTH INSURANCE | Attending: Hematology & Oncology

## 2020-03-22 ENCOUNTER — Ambulatory Visit: Payer: PRIVATE HEALTH INSURANCE

## 2020-03-22 DIAGNOSIS — Z170 Estrogen receptor positive status [ER+]: Secondary | ICD-10-CM

## 2020-03-22 DIAGNOSIS — C50911 Malignant neoplasm of unspecified site of right female breast: Secondary | ICD-10-CM

## 2020-03-22 DIAGNOSIS — Z79810 Long term (current) use of selective estrogen receptor modulators (SERMs): Secondary | ICD-10-CM

## 2020-03-22 NOTE — Progress Notes
Patient name:  Jenna Newman  MRN:  [id number]  DOB:  22-Sep-1970  CSN: [id number]  Date of encounter: 03/22/2020  Referring provider: No ref. provider found  PCP: Jacqulyn Liner, DO  Provider: Alric Seton. 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     Denies fevers, sweats, chills. No nausea or vomiting. Overall feeling well besides vasomotor symptoms, arthralgia and vaginal dryness, none of which are significantly harming her daily quality of life or function. No breast or chest wall pain, no bone pain, headaches, vision changes, bladder/bowel changes.     ONCOLOGY HISTORY:   11/27/18 b/l dx tomosynthesis mammo at [facility]: heterogeneously dense breast tissue. Distortion in the RUO breast peri-areolar region without suspicious calcifications.   11/27/18 RIGHT breast US: 15 x 16 x 22 mm mass at 11:00, 6 cm from the nipple, with a 1.4 cm extension of the mass off the superior aspect appearing continuous by a thin bridge of tissue. No frank right axillary adenopathy  12/06/18 US guided right breast bx: IDC, 8 mm, grade 2/3, SBR 7/9, ER/PR > 95%, Ki 67 25%, HER-2 FISH negative, with background grade 3 DCIS, cribriform pattern. No LCIS, no LVI, no microcalcifications  12/25/18 Genetics consultation: Myriad My Risk germline negative.   12/25/18 MRI breasts at [facility]: 2.8 x 2.1 x 2.2 cm mass at 11:00 axi right breast middle to anterior depth 5 cm from nipple, no e/o multi focal or multi centric or contralateral disease. No e/o nodal or metastatic dz seen.   12/31/18 Dr. Shanda Howells Surgical Oncology consult  01/27/19 RIGHT lumpectomy/SLN bx and LEFT breast reduction mammoplasty:   RIGHT: 3.6 cm IDC, grade 2/3, MBR 6/9, +LVI, no PNI, with 1 of 2 sentinel nodes positive for 8 mm metastatic cancer, with 3.5 cm DCIS, intermediate grade, comedo-type, cribriform and solid, with focally + CIS margins at superior/medial, invasive carcinoma margins uninvolved  LEFT: benign, no in situ or invasive dz.   02/17/19 RIGHT breast re-excision: UDH, apocrine metaplasia, no residual invasive carcinoma, focal residual DCIS measuring 16 mm, 0.2 mm from new lateral margin.   03/17/19 RIGHT nipple sparing mastectomy and level 1/2 ax node dissection with reconstruction (Dr. Renaldo Reel): focal, residual 3 mm DCIS, 6 mm from closest margin, no residual invasive cancer, ADH free of margins by 1.3 cm, 8 axillary nodes removed, all negative.   05/01/19 - 07/03/19 4 cycles TC adjuvant chemotherapy  06/20/19 Bone scan at [facility] given complaints of bone pain: negative  09/25/19 CT chest: negative  09/25/19 CT AP: negative  11/14/19 LEFT mammogram: heterogeneously dense, benign - BIRADS2  01/22/20 MR breast [facility]: Focal linear soft tissue and implant capsule thickening and enhancement along the inferior lateral aspect of the right breast implant. Probably benign and may represent residual postoperative change, but   second-look ultrasound is recommended.  02/23/20 RIGHT breast US: BIRADS 3--PROBABLY BENIGN: Right breast implant in patient with prior mastectomy. In the right lateral breast/chest wall, there are multiple small  cystic structures identified, ranging in size from 2 mm to 8 x 6 mm. These appear like anechoic cystic spaces on real-time ultrasound. This may represent postsurgical change in patient with prior complete mastectomy. Recommend six-month follow-up ultrasound to assure stability or resolution.      ECOG performance status: 0    Objective:  Current medications:  Outpatient Medications Prior to Visit   Medication Sig   ??? gabapentin 300 mg capsule Take 1 capsule (300 mg total) by mouth at bedtime as needed (sleep).   ??? tamoxifen 20 mg tablet Take 1 tablet (20 mg total) by mouth daily.     No facility-administered medications prior to visit.        Allergies:  No Known Allergies    Review of Systems:  A 14 point review of systems was negative besides what I note in my HPI above.     Past Medical History:  Past Medical History:   Diagnosis Date   ??? Breast cancer (HCC/RAF)        Past Surgical History:  Past Surgical History:   Procedure Laterality Date   ??? BREAST FIBROADENOMA SURGERY Bilateral 1992   ??? BREAST LUMPECTOMY Right 12/2018   ??? BREAST LUMPECTOMY Bilateral 01/27/2019   ??? CESAREAN SECTION  2006 & 2008   ??? CHOLECYSTECTOMY  2004   ??? KNEE SURGERY Left 2017    Ligament release    ??? MASTECTOMY Right 03/2019   ??? TONSILECTOMY, ADENOIDECTOMY, BILATERAL MYRINGOTOMY AND TUBES  1983   ??? TUBAL LIGATION  2016       Social History:  Social History     Tobacco Use   ??? Smoking status: Never Smoker   ??? Smokeless tobacco: Never Used   Substance Use Topics   ??? Alcohol use: Not Currently     Family History:  History reviewed. No pertinent family history.     Vitals:  BP: 122/62 (08/23 1341)  Temp: 36.6 ???C (97.9 ???F) (08/23 1341)  Temp Source: Temporal (08/23 1341)  Heart Rate: 61 (08/23 1341)  Resp: 16 (08/23 1341)  SpO2: 98 % (08/23 1341)  Height: --  Weight: 62.2 kg (137 lb 3.2 oz) (08/23 1341)    Physical Exam:  General: Appears well-developed, well-nourished and close to stated age.   Head: Normocephalic, atraumatic.  Eyes: PERRL without icterus.   ENT: Oropharynx is clear, mucus membranes are moist.    CV: Regular in rate and rhythm, no murmurs or gallops.   Chest: Clear to auscultation bilaterally without wheezing or rhonchi.  Respiratory effort appears normal.   Abdomen: Soft, nontender and nondistended. Bowel sounds are present and normoactive. No organomegaly is appreciated  Musculoskeletal: No edema. No cyanosis. Extremities are warm and well-perfused.   Hematologic: No bruising, purpura or petechiae are noted.   Dermatologic: No rashes appreciated.   Lymphatic: No palpable cervical, supraclavicular, axillary or inguinal adenopathy appreciated.   Psychiatric: Affect appropriate.  Pleasant and conversant  Breast: B/L reconstruction appreciated    Recent Labs:  Lab Results   Component Value Date    NA 143 10/31/2019    K 4.7 10/31/2019    CL 105 10/31/2019    CO2 28 10/31/2019    CREAT 0.78 10/31/2019    BUN 11 10/31/2019    GLUCOSE 107 (H) 10/31/2019    CALCIUM 9.4 10/31/2019    MG 1.6 07/03/2019     Lab Results   Component Value Date    WBC 5.46 10/31/2019    HGB 13.7 10/31/2019  HCT 43.3 10/31/2019    MCV 92.7 10/31/2019    PLT 253 10/31/2019    NEUTPCT 67.3 10/31/2019    NEUTABS 3.68 10/31/2019    MONOPCT 6.6 10/31/2019    MONOABS 0.36 10/31/2019     Lab Results   Component Value Date    TOTPRO 6.3 10/31/2019    ALBUMIN 4.4 10/31/2019    BILITOT 0.3 10/31/2019    BILICON <0.2 09/19/2019    AST 30 10/31/2019    ALT 25 10/31/2019    ALKPHOS 51 10/31/2019    GGT 42 09/12/2019     12/13/18: CEA = 0.8, [place] 27.29 = 27    Pertinent Imaging:  Per HPI     Pertinent Pathology:  Per HPI     Impression and Recommendations:  Jenna Newman is a 49 y.o. female with     1. Stage IIB pT2 (3.6 cm) cN1a (1/10 - 8 mm) cM0 RIGHT breast IDC, ER/PR > 95%, HER-2 FISH negative, Ki 67 25%, grade 2/3, SBR 7/9, +LVI, -PNI, and intermediate to high-grade DCIS, ultimately taken to clear margins with August 2020 nipple sparing mastectomy, RS= 16, s/p 4 cycles TC completed Dec 2020, initiating tamoxifen Feb 2021, clinically NED.   2. BIRADS3 RIGHT breast US July 2021, will repeat in 6 months.  3. Myriad My Risk germline mutation analysis: negative    - reviewed outside breast imaging  - will request repeat breast US around Jan 2022  - reviewed improved outcomes of ovarian suppression with either tamoxifen or AI therapy vs tamoxifen alone in those premenopausal females treated with adjuvant chemotherapy, but given toxicity profile of ovarian suppression, as discussed previously, opting for tamoxifen at this time  - pending endocrine tolerance, will get BCI moving forward if extended endocrine therapy will be considered    This writer has deemed the above diagnoses to have a risk of complication, morbidity or mortality of:   []  Minimal  []  Low  [x]  Moderate   [x]  due to prescription drug management   [] Decision re: minor surgery   [] Diagnosis or treatment limited by social determinants of health  []  Severe    []  due to intensive monitoring for chemotherapy toxicity   []  Decision elective surgery with risk factors  []  Decision regarding need for hospitalization  []  Advanced Care Planning decisions      Return to clinic:  Return in about 6 months (around 09/22/2020) for see MD for breast exam, see MD and review scans.    Orders:  Orders Placed This Encounter   ??? [name] right breast, diagnostic   ??? CBC & Auto Differential   ??? Comprehensive Metabolic Panel   ??? Lipid Panel       Alric Seton. Zoila Shutter, MD  Assistant Professor of Medicine  Division of Hematology/Oncology  Phone: [phone]  Fax: [phone]  Email: [email or web address]    Attending Physician: Alric Seton. Zoila Shutter, MD.  Author: Alric Seton. Zoila Shutter, MD

## 2020-03-23 ENCOUNTER — Telehealth: Payer: PRIVATE HEALTH INSURANCE

## 2020-03-23 NOTE — Telephone Encounter
[name] Right Breast Diag [facility] Pending Apt Fx 8/24          All forms faxed 03/23/20      [place]:  [phone]  ~   FAX: [phone]

## 2020-03-25 NOTE — Telephone Encounter
[name] Right Breast Diag Jenna Newman Appt 1/10 @12 :45           Scheduled with Katharine Look     All forms faxed 03/23/20    PT aware Jenna Newman sched W.PT     [place]: [phone]   ~   FAX: [phone]

## 2020-04-01 ENCOUNTER — Ambulatory Visit: Payer: PRIVATE HEALTH INSURANCE

## 2020-04-01 DIAGNOSIS — Z23 Encounter for immunization: Secondary | ICD-10-CM

## 2020-04-12 MED ORDER — GABAPENTIN 300 MG PO CAPS
ORAL_CAPSULE | 0 refills
Start: 2020-04-12 — End: ?

## 2020-04-28 MED ORDER — GABAPENTIN 300 MG PO CAPS
300 mg | ORAL_CAPSULE | Freq: Every evening | ORAL | 0 refills | PRN
Start: 2020-04-28 — End: ?

## 2020-04-29 MED ORDER — GABAPENTIN 300 MG PO CAPS
300 mg | ORAL_CAPSULE | Freq: Every evening | ORAL | 0 refills | PRN
Start: 2020-04-29 — End: ?

## 2020-05-05 MED ORDER — GABAPENTIN 300 MG PO CAPS
300 mg | ORAL_CAPSULE | Freq: Every evening | ORAL | 3 refills | Status: AC | PRN
Start: 2020-05-05 — End: ?

## 2020-05-21 MED ORDER — GABAPENTIN 300 MG PO CAPS
300 mg | ORAL_CAPSULE | Freq: Every evening | ORAL | 0 refills | PRN
Start: 2020-05-21 — End: ?

## 2020-05-29 MED ORDER — TAMOXIFEN CITRATE 20 MG PO TABS
ORAL_TABLET | ORAL | 0 refills | 60.00000 days
Start: 2020-05-29 — End: ?

## 2020-05-31 MED ORDER — TAMOXIFEN CITRATE 20 MG PO TABS
ORAL_TABLET | ORAL | 0 refills | 60.00000 days | Status: AC
Start: 2020-05-31 — End: ?

## 2020-07-26 ENCOUNTER — Ambulatory Visit: Payer: PRIVATE HEALTH INSURANCE

## 2020-08-10 MED ORDER — GABAPENTIN 300 MG PO CAPS
300 mg | ORAL_CAPSULE | Freq: Every evening | ORAL | 0 refills | PRN
Start: 2020-08-10 — End: ?

## 2020-08-11 MED ORDER — GABAPENTIN 300 MG PO CAPS
300 mg | ORAL_CAPSULE | Freq: Every evening | ORAL | 3 refills | Status: AC | PRN
Start: 2020-08-11 — End: ?

## 2020-09-24 ENCOUNTER — Ambulatory Visit: Payer: PRIVATE HEALTH INSURANCE | Attending: Hematology & Oncology

## 2020-09-24 ENCOUNTER — Telehealth: Payer: PRIVATE HEALTH INSURANCE

## 2020-09-24 DIAGNOSIS — Z79810 Long term (current) use of selective estrogen receptor modulators (SERMs): Secondary | ICD-10-CM

## 2020-09-24 DIAGNOSIS — N951 Menopausal and female climacteric states: Secondary | ICD-10-CM

## 2020-09-24 DIAGNOSIS — Z170 Estrogen receptor positive status [ER+]: Secondary | ICD-10-CM

## 2020-09-24 DIAGNOSIS — C771 Secondary and unspecified malignant neoplasm of intrathoracic lymph nodes: Secondary | ICD-10-CM

## 2020-09-24 DIAGNOSIS — C50911 Malignant neoplasm of unspecified site of right female breast: Secondary | ICD-10-CM

## 2020-09-24 NOTE — Patient Instructions
Please have your mammogram at the [facility] several days before your return visit

## 2020-09-24 NOTE — Telephone Encounter
Mammo Diag L breast [facility] Pt to sched       PT to sched     All forms faxed  09/24/20    PT aware AVS and info printed. Stated she will call to sched     [place]: [phone]   ~   [place]: [phone]

## 2020-09-24 NOTE — Nursing Note
Patient presented to clinic for blood draw. Labs drawn via venipuncture from patients left arm.

## 2020-09-24 NOTE — Progress Notes
Patient name:  Jenna Newman  MRN:  [id number]  DOB:  04-13-1971  CSN: [id number]  Date of encounter: 09/24/2020  Referring provider: No ref. provider found  PCP: Jacqulyn Liner, DO  Provider: Alric Seton. 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     Right breast US 08/09/20 noted multiple cysts, BIRADS3 (probably benign), with recommendation for follow up ultrasound in 6 months.     Jenna Newman has a history of eosinophilic esophagitis and is followed by Dr. Orville Govern. Occasionally notes dysphagia, will schedule follow up accordingly.     Questions if she has evolving systemic auto immune disease, will schedule visit with PCP to start workup.     Denies fevers, sweats, chills. No nausea or vomiting. Stable vasomotor symptoms, arthralgia and vaginal dryness. No breast or chest wall pain, no bone pain, headaches, vision changes, bladder/bowel changes. Has put on some weight. Eating healthy, trying to get more exercise.    ONCOLOGY HISTORY:   11/27/18 b/l dx tomosynthesis mammo at [facility]: heterogeneously dense breast tissue. Distortion in the RUO breast peri-areolar region without suspicious calcifications.   11/27/18 RIGHT breast US: 15 x 16 x 22 mm mass at 11:00, 6 cm from the nipple, with a 1.4 cm extension of the mass off the superior aspect appearing continuous by a thin bridge of tissue. No frank right axillary adenopathy  12/06/18 US guided right breast bx: IDC, 8 mm, grade 2/3, SBR 7/9, ER/PR > 95%, Ki 67 25%, HER-2 FISH negative, with background grade 3 DCIS, cribriform pattern. No LCIS, no LVI, no microcalcifications  12/25/18 Genetics consultation: Myriad My Risk germline negative.   12/25/18 MRI breasts at [facility]: 2.8 x 2.1 x 2.2 cm mass at 11:00 axi right breast middle to anterior depth 5 cm from nipple, no e/o multi focal or multi centric or contralateral disease. No e/o nodal or metastatic dz seen.   12/31/18 Dr. Shanda Howells Surgical Oncology consult  01/27/19 RIGHT lumpectomy/SLN bx and LEFT breast reduction mammoplasty:   RIGHT: 3.6 cm IDC, grade 2/3, MBR 6/9, +LVI, no PNI, with 1 of 2 sentinel nodes positive for 8 mm metastatic cancer, with 3.5 cm DCIS, intermediate grade, comedo-type, cribriform and solid, with focally + CIS margins at superior/medial, invasive carcinoma margins uninvolved  LEFT: benign, no in situ or invasive dz.   02/17/19 RIGHT breast re-excision: UDH, apocrine metaplasia, no residual invasive carcinoma, focal residual DCIS measuring 16 mm, 0.2 mm from new lateral margin.   03/17/19 RIGHT nipple sparing mastectomy and level 1/2 ax node dissection with reconstruction (Dr. Renaldo Reel): focal, residual 3 mm DCIS, 6 mm from closest margin, no residual invasive cancer, ADH free of margins by 1.3 cm, 8 axillary nodes removed, all negative.   05/01/19 - 07/03/19 4 cycles TC adjuvant chemotherapy  06/20/19 [facility] scan at [facility] given complaints of bone pain: negative  09/25/19 CT chest: negative  09/25/19 CT AP: negative  11/14/19 LEFT mammogram: heterogeneously  dense, benign - BIRADS2  01/22/20 MR breast [facility]: Focal linear soft tissue and implant capsule thickening and enhancement along the inferior lateral aspect of the right breast implant. Probably benign and may represent residual postoperative change, but   second-look ultrasound is recommended.  02/23/20 RIGHT breast US: BIRADS 3--PROBABLY BENIGN: Right breast implant in patient with prior mastectomy. In the right lateral breast/chest wall, there are multiple small cystic structures identified, ranging in size from 2 mm to 8 x 6 mm. These appear like anechoic cystic spaces on real-time ultrasound. This may represent postsurgical change in patient with prior complete mastectomy. Recommend six-month follow-up ultrasound to assure stability or resolution.  08/09/20 Right breast US: multiple cysts, BIRADS3 (probably benign), with recommendation for follow up ultrasound in 6 months.    ECOG performance status: 0    Objective:  Current medications:  Outpatient Medications Prior to Visit   Medication Sig   ??? gabapentin 300 mg capsule Take 1 capsule (300 mg total) by mouth at bedtime as needed (sleep).   ??? TAMOXIFEN 20 mg tablet TAKE ONE TABLET BY MOUTH EVERY DAY      No facility-administered medications prior to visit.       Allergies:  No Known Allergies    Review of Systems:  A 14 point review of systems was negative besides what I note in my HPI above.     Past Medical History:  Past Medical History:   Diagnosis Date   ??? Breast cancer (HCC/RAF)        Past Surgical History:  Past Surgical History:   Procedure Laterality Date   ??? BREAST FIBROADENOMA SURGERY Bilateral 1992   ??? BREAST LUMPECTOMY Right 12/2018   ??? BREAST LUMPECTOMY Bilateral 01/27/2019   ??? CESAREAN SECTION  2006 & 2008   ??? CHOLECYSTECTOMY  2004   ??? KNEE SURGERY Left 2017    Ligament release    ??? MASTECTOMY Right 03/2019   ??? TONSILECTOMY, ADENOIDECTOMY, BILATERAL MYRINGOTOMY AND TUBES  1983   ??? TUBAL LIGATION  2016       Social History:  Social History     Tobacco Use   ??? Smoking status: Never Smoker   ??? Smokeless tobacco: Never Used   Substance Use Topics   ??? Alcohol use: Not Currently     Family History:  No family history on file.     Vitals:  BP: 110/73 (02/25 1151)  Temp: 36.7 ???C (98 ???F) (02/25 1151)  Temp Source: Oral (02/25 1151)  Heart Rate: 89 (02/25 1151)  Resp: 18 (02/25 1151)  SpO2: 97 % (02/25 1151)  Height: --  Weight: --    Physical Exam:  General: Appears well-developed, well-nourished and close to stated age.   Head: Normocephalic, atraumatic.  Eyes: PERRL without icterus.   ENT: Oropharynx is clear, mucus membranes are moist.    CV: Regular in rate and rhythm, no murmurs or gallops.   Chest: Clear to auscultation bilaterally without wheezing or rhonchi.  Respiratory effort appears normal.   Abdomen: Soft, nontender and nondistended. Bowel sounds are present and normoactive. No organomegaly is appreciated  Musculoskeletal: No edema. No cyanosis. Extremities are warm and well-perfused.   Hematologic: No bruising, purpura or petechiae are noted.   Dermatologic: No rashes appreciated.   Lymphatic: No palpable cervical, supraclavicular, axillary or inguinal adenopathy appreciated.   Psychiatric: Affect appropriate.  Pleasant and conversant  Breast: B/L reconstruction appreciated    Recent Labs:  Lab Results   Component Value Date  NA 141 03/23/2020    K 4.3 03/23/2020    CL 105 03/23/2020    CO2 28 03/23/2020    CREAT 0.80 03/23/2020    BUN 9 03/23/2020    GLUCOSE 88 03/23/2020    CALCIUM 9.0 03/23/2020    MG 1.6 07/03/2019     Lab Results   Component Value Date    WBC 5.5 09/24/2020    HGB 13.5 09/24/2020    HCT 39.8 09/24/2020    MCV 93.0 09/24/2020    PLT 222 09/24/2020    NEUTPCT 69.9 09/24/2020    NEUTABS 3.84 09/24/2020    MONOPCT 6.9 09/24/2020    MONOABS 0.38 09/24/2020     Lab Results   Component Value Date    TOTPRO 6.5 03/23/2020    ALBUMIN 4.4 03/23/2020    BILITOT 0.4 03/23/2020    BILICON <0.2 09/19/2019    AST 31 03/23/2020    ALT 33 03/23/2020    ALKPHOS 53 03/23/2020    GGT 42 09/12/2019     12/13/18: CEA = 0.8, [place] 27.29 = 27    Pertinent Imaging:  Per HPI     Pertinent Pathology:  Per HPI     Impression and Recommendations:  TALIA HOHEISEL is a 50 y.o. female with     1. Stage IIB pT2 (3.6 cm) cN1a (1/10 - 8 mm) cM0 RIGHT breast IDC, ER/PR > 95%, HER-2 FISH negative, Ki 67 25%, grade 2/3, SBR 7/9, +LVI, -PNI, and intermediate to high-grade DCIS, ultimately taken to clear margins with August 2020 nipple sparing mastectomy, RS= 16, s/p 4 cycles TC completed Dec 2020, initiating tamoxifen Feb 2021, clinically NED.   2. BIRADS3 RIGHT breast US Jan 2022, will repeat in 6 months.  3. Myriad My Risk germline mutation analysis: negative    - reviewed outside breast imaging  - repeat left breast mammogram April 2022  - repeat right breast US July 2022  - follow up labs today, will include tumor markers per Barbie's request, repeat estradiol/FSH  - return visit April to review mammogram, will likely transition to AI at that time  - pending endocrine tolerance, will get BCI moving forward if extended endocrine therapy will be considered    This writer has deemed the above diagnoses to have a risk of complication, morbidity or mortality of:   []  Minimal  []  Low  [x]  Moderate   [x]  due to prescription drug management   [] Decision re: minor surgery   [] Diagnosis or treatment limited by social determinants of health  []  Severe    []  due to intensive monitoring for chemotherapy toxicity   []  Decision elective surgery with risk factors  []  Decision regarding need for hospitalization  []  Advanced Care Planning decisions      Return to clinic:  Return in about 2 months (around 11/22/2020) for see MD and review scans.    Orders:  Orders Placed This Encounter   ??? Mammogram, diagnostic, left breast   ??? Comprehensive Metabolic Panel   ??? CBC & Auto Differential   ??? CEA   ??? CA27.29   ??? FSH   ??? Estradiol       Alric Seton. Zoila Shutter, MD  Assistant Professor of Medicine  Division of Hematology/Oncology  Phone: [phone]  Fax: [phone]  Email: [email or web address]    Attending Physician: Alric Seton. Zoila Shutter, MD.  Author: Alric Seton. Zoila Shutter, MD

## 2020-09-25 LAB — Follicle Stimulating Hormone: FSH: 44.1 m[IU]/mL

## 2020-09-25 LAB — Comprehensive Metabolic Panel: POTASSIUM: 4.1 mmol/L (ref 3.6–5.3)

## 2020-09-25 LAB — CEA: CARCINOEMBRYONIC ANTIGEN: 1 ng/mL (ref <3.1)

## 2020-09-25 LAB — Estradiol: ESTRADIOL: <12 pg/mL

## 2020-09-27 ENCOUNTER — Ambulatory Visit: Payer: PRIVATE HEALTH INSURANCE

## 2020-09-27 DIAGNOSIS — Z1211 Encounter for screening for malignant neoplasm of colon: Secondary | ICD-10-CM

## 2020-09-27 DIAGNOSIS — Z114 Encounter for screening for human immunodeficiency virus [HIV]: Secondary | ICD-10-CM

## 2020-09-27 DIAGNOSIS — R0982 Postnasal drip: Secondary | ICD-10-CM

## 2020-09-27 DIAGNOSIS — Z23 Encounter for immunization: Secondary | ICD-10-CM

## 2020-09-27 DIAGNOSIS — M130 Polyarthritis, unspecified: Secondary | ICD-10-CM

## 2020-09-27 DIAGNOSIS — T7840XA Allergy, unspecified, initial encounter: Secondary | ICD-10-CM

## 2020-09-27 DIAGNOSIS — R21 Rash and other nonspecific skin eruption: Secondary | ICD-10-CM

## 2020-09-27 LAB — C-Reactive Protein: C-REACTIVE PROTEIN: <0.3 mg/dL (ref <0.8)

## 2020-09-27 LAB — Rheumatoid Factor: RHEUMATOID FACTOR: <10 [IU]/mL (ref <14)

## 2020-09-27 LAB — Sedimentation Rate, Erythrocyte: SEDIMENTATION RATE, ERYTHROCYTE: 4 mm/h (ref <=25)

## 2020-09-27 MED ORDER — MONTELUKAST SODIUM 10 MG PO TABS
10 mg | ORAL_TABLET | Freq: Every evening | ORAL | 2 refills | Status: AC
Start: 2020-09-27 — End: ?

## 2020-09-27 NOTE — Patient Instructions
-  Singulair for allergic rhinitis, and rash, follow up 1 month to see improvement  -Labs for any possible autoimmune to explain rash and joint pains   -derm referral for blisters in ears nose and rash/acne on face

## 2020-09-27 NOTE — Progress Notes
PATIENT: Jenna Newman  MRN: [id number]  DOB: 1970-11-02  DATE OF SERVICE: 09/27/2020    CHIEF COMPLAINT:   Chief Complaint   Patient presents with   ??? Establish Care     rash on face         HPI   Jenna Newman is a 50 y.o. female presents for   Chief Complaint   Patient presents with   ??? Establish Care     rash on face        History of breast cancer and chemotherapy  -diagnosis May 2020, chemo ended Dec 2020, s/p mastectomy     Rashes on face   A lot of congestion and post nasal drip  Joint pain in elbow and knee  Eosinophilic esophagitis followed by dr Orville Govern         MEDS     Medications that the patient states to be currently taking   Medication Sig   ??? amoxicillin 500 mg capsule TAKE ONE CAPSULE BY MOUTH THREE TIMES DAILY FOR 7 DAYS   ??? Gabapentin 10 % CREA Take by mouth.   ??? gabapentin 300 mg capsule Take 1 capsule (300 mg total) by mouth at bedtime as needed (sleep).   ??? TAMOXIFEN 20 mg tablet TAKE ONE TABLET BY MOUTH EVERY DAY    ??? tamoxifen 20 mg tablet 1 tablet       PHYSICAL EXAM      Last Recorded Vital Signs:    09/27/20 0943   BP: 118/79   Pulse: 65   Temp: 36.8 ???C (98.3 ???F)   SpO2: 98%     Body mass index is 25.37 kg/m???.    System Check if normal Positive or additional negative findings   Constit  [x]  General appearance     Eyes  [x]  Conj/Lids [x]  Pupils  [x]  Fundi     HENMT  []  External ears/nose []  Otoscopy   [x]  Gross Hearing []  Nasal mucosa   []  Lips/teeth/gums []  Oropharynx    []  mucus membranes []  Head     Neck  []  Inspection/palpation []  Thyroid     Resp  [x]  Effort []  Wheezing    []  Auscultation  []  Crackles     CV  [x]  Rhythm/rate   []  Murmurs   []  LEE   []  JVP non-elevated    Normal pulses:   []  Radial []  Femoral  []  Pedal     Breast  []  Inspection []  Palpation     GI  []  abd masses    []  tenderness   []  rebound/guarding   []  Liver/spleen []  Rectal     GU  M: []  Scrotum []  Penis []  Prostate   F:  []  External []  vaginal wall        []  Cervix  []  mucus        []  Uterus    []  Adnexa      Lymph []  Neck []  Axillae []  Groin     MSK Specify site examined:    []  Inspect/palp []  ROM   []  Stability [x]  Strength/tone         Skin  []  Inspection []  Palpation     Neuro  [x]  CN2-12 intact grossly   [x]  Alert and oriented   []  DTR      [x]  Muscle strength      []  Sensation   [x]  Gait/balance     Psych  [x]  Insight/judgement     [x]  Mood/affect    [x]   Gross cognition        LABS/STUDIES   I have:   []  Reviewed/ordered []  1 []  2 []  ? 3 unique laboratory, radiology, and/or diagnostic tests noted below    []  Reviewed []  1 []  2 []  ? 3 prior external notes and incorporated into patient assessment    []  Discussed management or test interpretation with external provider(s) as noted       Lab Studies:  Office Visit on 09/24/2020   Component Date Value Ref Range Status   ??? Estradiol 09/24/2020 <12  pg/mL Final    ESTRADIOL      Reference Range (female):    Prepubertal:        <12  pg/mL    Post-menopausal:    <21  pg/mL    Follicular phase: 20-100 pg/mL    Mid-cycle:        80-400 pg/mL    Luteal phase:     30-220 pg/mL    Ingestion of high levels of biotin in dietary supplements may lead to falsely increased results.   ??? [facility] 09/24/2020 44.1  See Comment mIU/mL Final    [facility] Reference Range:  Adult Female:    Follicular phase:   2-8 mIU/mL    Mid-cycle:         6-23 mIU/mL    Luteal phase:       1-6 mIU/mL    Post-menopausal: 21-106 mIU/mL  Prepubertal Children: 1-6 mIU/mL  Pregnant:              <6 mIU/mL  Ingestion of high levels of biotin in dietary supplements may lead to falsely decreased results.   ??? CEA 09/24/2020 1.0  <3.1 ng/mL Final    Some smokers may have elevated CEA, usually less than 5.1 ng/mL.    Ingestion of high levels of biotin in dietary supplements may lead to falsely decreased results.   ??? WBC (LabDAQ) 09/24/2020 5.5  4.0 - 10.0 10???/uL Final   ??? RBC (LabDAQ) 09/24/2020 4.3  3.9 - 6.1 x10E6/uL Final   ??? Hemoglobin (LabDAQ) 09/24/2020 13.5  11.2 - 15.7 g/dL Final   ??? Hematocrit (LabDAQ) 09/24/2020 39.8  34.1 - 44.9 % Final   ??? MCV (LabDAQ) 09/24/2020 93.0  79.0 - 94.8 fL Final   ??? MCH (LabDAQ) 09/24/2020 31.5  25.6 - 32.2 pg Final   ??? MCHC (LabDAQ) 09/24/2020 33.9  32.2 - 36.5 g/dL Final   ??? RDW-CV (LabDAQ) 09/24/2020 11.2* 11.6 - 14.4 % Final   ??? Platelets (LabDAQ) 09/24/2020 222  163 - 369 10???/uL Final   ??? MPV (LabDAQ) 09/24/2020 9.0* 9.4 - 12.4 fL Final   ??? Neutrophil % (LabDAQ) 09/24/2020 69.9  34.0 - 71.1 % Final   ??? Lymphocyte % (LabDAQ) 09/24/2020 18.6* 19.3 - 53.1 % Final   ??? Monocyte % (LabDAQ) 09/24/2020 6.9  4.7 - 12.5 % Final   ??? Eosinophil % (LabDAQ) 09/24/2020 4.2  0.7 - 7.0 % Final   ??? Basophil % (LabDAQ) 09/24/2020 0.4  0.1 - 1.2 % Final   ??? Neutrophil # (LabDAQ) 09/24/2020 3.84  1.56 - 6.13 10???/uL Final   ??? Lymphocyte # (LabDAQ) 09/24/2020 1.02* 1.18 - 3.74 10???/uL Final   ??? Monocyte # (LabDAQ) 09/24/2020 0.38  0.24 - 0.86 10???/uL Final   ??? Eosinophil # (LabDAQ) 09/24/2020 0.23  0.04 - 0.54 10???/uL Final   ??? Basophil # (LabDAQ) 09/24/2020 0.02  0.01 - 0.08 10???/uL Final   ??? Sodium 09/24/2020 143  135 - 146  mmol/L Final   ??? Potassium 09/24/2020 4.1  3.6 - 5.3 mmol/L Final   ??? Chloride 09/24/2020 105  96 - 106 mmol/L Final   ??? Total CO2 09/24/2020 29  20 - 30 mmol/L Final   ??? Anion Gap 09/24/2020 9  8 - 19 mmol/L Final   ??? Glucose 09/24/2020 97  65 - 99 mg/dL Final   ??? Creatinine 09/24/2020 0.74  0.60 - 1.30 mg/dL Final   ??? GFR Estimate for African American 09/24/2020 >89  See GFR Additional Information mL/min/1.13m2 Final   ??? GFR Estimate for Non-African Ameri* 09/24/2020 >89  See GFR Additional Information mL/min/1.72m2 Final   ??? GFR Additional Information 09/24/2020 See Comment   Final    GFR >89        Normal  GFR 60 - 89    Normal to mildly decreased  GFR 45 - 59    Mildly to moderately decreased  GFR 30 - 44    Moderately to severely decreased  GFR 15 - 29    Severely decreased  GFR <15        Kidney failure  The CKD-EPI equation was used to estimate GFR and assumes stable creatinine concentrations. Results are in mL/min/1.73 square meters.   ??? Urea Nitrogen 09/24/2020 10  7 - 22 mg/dL Final   ??? Calcium 45/40/9811 9.0  8.6 - 10.4 mg/dL Final   ??? Total Protein 09/24/2020 6.6  6.1 - 8.2 g/dL Final   ??? Albumin 91/47/8295 4.5  3.9 - 5.0 g/dL Final   ??? Bilirubin,Total 09/24/2020 0.3  0.1 - 1.2 mg/dL Final   ??? Alkaline Phosphatase 09/24/2020 53  37 - 113 U/L Final   ??? Aspartate Aminotransferase 09/24/2020 36  13 - 62 U/L Final    NOTE: New Reference Range.     ??? Alanine Aminotransferase 09/24/2020 41  8 - 70 U/L Final    NOTE: New Reference Range.           2018 ACC/AHA guidelines recommends that patient is not in statin benefit group. Encourage adherence to heart-healthy lifestyle.    10-Year ASCVD risk is 0.8% as of 2:08 PM on 09/27/2020.  10-Year ASCVD risk with optimal risk factors is 0.6%.  Values used to calculate ASCVD score:  Age: 50 y.o.   Gender: Female Race: White or Caucasian  HDL cholesterol: 49 mg/dL. HDL cholesterol measured on 03/23/2020.  Total cholesterol: 161 mg/dL. Total cholesterol measured on 03/23/2020.  LDL cholesterol: 99 mg/dL. LDL cholesterol measured on 03/23/2020.  Systolic BP: 118 mm Hg. BP was measured on 09/27/2020.  The patient is not being treated with a medication that influences SBP.  The patient is currently not a smoker.  The patient does not have a diagnosis of diabetes.  Click here for the [facility] ASCVD Cardiovascular Risk Estimator Plus tool Office manager).    Imaging Studies:   No new imaging reviewed today    A&P   Jenna Newman is a 50 y.o. female presenting for   Chief Complaint   Patient presents with   ??? Establish Care     rash on face          PROBLEM & ORDERS    ICD-10-CM    1. Erosive esophagitis  K22.10    2. Polyarthritis  M13.0 Antinuclear Ab     dsDNA Ab EIA     Sm/RNP Antibodies     Smooth Muscle Ab     Rheumatoid Factor     Cyclic Citrulline  Ab IgG     Sedimentation Rate, Erythrocyte     C-Reactive Protein   3. Rash  R21 Antinuclear Ab     dsDNA Ab EIA     Sm/RNP Antibodies     Smooth Muscle Ab     Rheumatoid Factor     Cyclic Citrulline Ab IgG     Sedimentation Rate, Erythrocyte     C-Reactive Protein     Referral to Dermatology     montelukast 10 mg tablet   4. Post-nasal drip  R09.82 montelukast 10 mg tablet   5. Allergy, initial encounter  T78.40XA montelukast 10 mg tablet   6. Screening for colon cancer  Z12.11 Referral for Colonoscopy Procedure   7. Screening for HIV (human immunodeficiency virus)  Z11.4 HIV-1/2 Ag/Ab 4th Generation with Reflex Confirmation   8. Vaccine for diphtheria-tetanus-pertussis, combined  Z23 Tdap vaccine >= to 50yo IM       ASSESSMENT      -Singulair for allergic rhinitis, and rash, follow up 1 month to see improvement  -Labs for any possible autoimmune to explain rash and joint pains   -derm referral for blisters in ears nose and rash/acne on face     The above recommendation were discussed with the patient.  The patient has all questions answered satisfactorily and is in agreement with this recommended plan of care.    No follow-ups on file.     Author:  Jacqulyn Liner 09/27/2020 2:08 PM

## 2020-09-28 LAB — Cancer Antigen 27.29: CANCER ANTIGEN 27.29: 21.2 U/mL (ref <=39.0)

## 2020-09-28 LAB — HIV-1/2 Ag/Ab 4th Generation with Reflex Confirmation: HIV-1/2 AG/AB 4TH GENERATION WITH REFLEX CONFIRMATION: NONREACTIVE

## 2020-09-28 LAB — dsDNA Ab EIA: DSDNA AB EIA: <=200 [IU]/mL (ref <=200)

## 2020-09-28 LAB — Antinuclear Ab: ANTINUCLEAR AB: <1:40 {titer}

## 2020-09-28 LAB — Sm/RNP Abs: RNP ANTIBODY: <20 U (ref <20)

## 2020-09-29 LAB — Cyclic Citrulline Ab IgG: CYCLIC CITRULLINE AB IGG: 2 U (ref 0–19)

## 2020-09-30 LAB — Smooth Muscle Ab: SMOOTH MUSCLE AB: <1:20 {titer}

## 2020-10-11 NOTE — Telephone Encounter
F/u on scans as no update as been made       Per Clarene Critchley at [facility] - nothing has been scheduled as of yet & no referral has been rcvd     Barnett Applebaum -  Can you please re-fax referral & reach out to pt as a reminder prior to her RTN next month         Thank you

## 2020-10-11 NOTE — Telephone Encounter
Reply by: Edwin Cap   Perfect!     Thank you Miss Barnett Applebaum   I appreciate your help

## 2020-10-11 NOTE — Telephone Encounter
Reply by: Daryll Brod  Pt stated she was aware of this needing to be scheduled. She will be calling today or tomorrow and I have ref-axed everything over to [facility] breast center. Thank you.

## 2020-10-22 ENCOUNTER — Ambulatory Visit: Payer: PRIVATE HEALTH INSURANCE

## 2020-11-03 NOTE — Telephone Encounter
Called PT to follow up with Apt, she stated that she has not scheduled as of yet and will call [facility] now. Advised PT to let us know when she is scheduled and hopefully prior to RTN visit.

## 2020-11-12 NOTE — Telephone Encounter
Left V.m for [facility] breast center in regards to Apt.       Called PT left V.m to follow up to see if apt was scheduled

## 2020-11-13 MED ORDER — TAMOXIFEN CITRATE 20 MG PO TABS
ORAL_TABLET | 0 refills
Start: 2020-11-13 — End: ?

## 2020-11-15 MED ORDER — TAMOXIFEN CITRATE 20 MG PO TABS
ORAL_TABLET | 0 refills | Status: AC
Start: 2020-11-15 — End: 2020-11-30

## 2020-11-19 NOTE — Telephone Encounter
Mammo Diag L breast [facility] 4/25 1:45     FYI-  Confirmed apt with Salena Saner at [facility]. Message sent to Dr. Lutricia Feil to see if he would like to keep RTN or reschedule.

## 2020-11-22 ENCOUNTER — Ambulatory Visit: Payer: PRIVATE HEALTH INSURANCE | Attending: Hematology & Oncology

## 2020-11-24 ENCOUNTER — Ambulatory Visit: Payer: PRIVATE HEALTH INSURANCE

## 2020-11-29 ENCOUNTER — Ambulatory Visit: Payer: PRIVATE HEALTH INSURANCE | Attending: Hematology & Oncology

## 2020-11-29 DIAGNOSIS — Z170 Estrogen receptor positive status [ER+]: Secondary | ICD-10-CM

## 2020-11-29 DIAGNOSIS — C50911 Malignant neoplasm of unspecified site of right female breast: Secondary | ICD-10-CM

## 2020-11-29 DIAGNOSIS — Z79810 Long term (current) use of selective estrogen receptor modulators (SERMs): Secondary | ICD-10-CM

## 2020-11-29 MED ORDER — ANASTROZOLE 1 MG PO TABS
1 mg | ORAL_TABLET | Freq: Every day | ORAL | 6 refills | Status: AC
Start: 2020-11-29 — End: ?

## 2020-11-29 NOTE — Patient Instructions
Please have your breast ultrasound and DEXA prior to your return visit

## 2020-11-29 NOTE — Progress Notes
Patient name:  Jenna Newman  MRN:  [id number]  DOB:  Apr 20, 1971  CSN: [id number]  Date of encounter: 11/29/2020  Referring provider: No ref. provider found  PCP: Jacqulyn Liner, DO  Provider: Alric Seton. 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     B/L mammogram 11/22/20 with dense breast tissue, otherwise benign.    FSH and estradiol c/w post menopausal status Feb 2022.     Stable vasomotor symptoms, vaginal dryness. No fevers, sweats, chills. No nausea or vomiting. No breast or chest wall pain, no bone pain, headaches, vision changes, bladder/bowel changes. Eating healthy, trying to exercise more.    ONCOLOGY HISTORY:   11/27/18 b/l dx tomosynthesis mammo at [facility]: heterogeneously dense breast tissue. Distortion in the RUO breast peri-areolar region without suspicious calcifications.   11/27/18 RIGHT breast US: 15 x 16 x 22 mm mass at 11:00, 6 cm from the nipple, with a 1.4 cm extension of the mass off the superior aspect appearing continuous by a thin bridge of tissue. No frank right axillary adenopathy  12/06/18 US guided right breast bx: IDC, 8 mm, grade 2/3, SBR 7/9, ER/PR > 95%, Ki 67 25%, HER-2 FISH negative, with background grade 3 DCIS, cribriform pattern. No LCIS, no LVI, no microcalcifications  12/25/18 Genetics consultation: Myriad My Risk germline negative.   12/25/18 MRI breasts at [facility]: 2.8 x 2.1 x 2.2 cm mass at 11:00 axi right breast middle to anterior depth 5 cm from nipple, no e/o multi focal or multi centric or contralateral disease. No e/o nodal or metastatic dz seen.   12/31/18 Dr. Shanda Howells Surgical Oncology consult  01/27/19 RIGHT lumpectomy/SLN bx and LEFT breast reduction mammoplasty:   RIGHT: 3.6 cm IDC, grade 2/3, MBR 6/9, +LVI, no PNI, with 1 of 2 sentinel nodes positive for 8 mm metastatic cancer, with 3.5 cm DCIS, intermediate grade, comedo-type, cribriform and solid, with focally + CIS margins at superior/medial, invasive carcinoma margins uninvolved  LEFT: benign, no in situ or invasive dz.   02/17/19 RIGHT breast re-excision: UDH, apocrine metaplasia, no residual invasive carcinoma, focal residual DCIS measuring 16 mm, 0.2 mm from new lateral margin.   03/17/19 RIGHT nipple sparing mastectomy and level 1/2 ax node dissection with reconstruction (Dr. Renaldo Reel): focal, residual 3 mm DCIS, 6 mm from closest margin, no residual invasive cancer, ADH free of margins by 1.3 cm, 8 axillary nodes removed, all negative.   05/01/19 - 07/03/19 4 cycles TC adjuvant chemotherapy  06/20/19 Bone scan at [facility] given complaints of bone pain: negative  09/25/19 CT chest: negative  09/25/19 CT AP: negative  11/14/19 LEFT mammogram: heterogeneously dense, benign - BIRADS2  01/22/20 MR breast [facility]: Focal linear soft tissue and implant capsule thickening and enhancement along the inferior lateral aspect of the right breast implant. Probably benign and may represent residual postoperative change, but   second-look ultrasound is recommended.  02/23/20 RIGHT breast US: BIRADS 3--PROBABLY BENIGN:  Right breast implant in patient with prior mastectomy. In the right lateral breast/chest wall, there are multiple small cystic structures identified, ranging in size from 2 mm to 8 x 6 mm. These appear like anechoic cystic spaces on real-time ultrasound. This may represent postsurgical change in patient with prior complete mastectomy. Recommend six-month follow-up ultrasound to assure stability or resolution.  08/09/20 Right breast US: multiple cysts, BIRADS3 (probably benign), with recommendation for follow up ultrasound in 6 months.    ECOG performance status: 0    Objective:  Current medications:  Outpatient Medications Prior to Visit   Medication Sig   ??? gabapentin 300 mg capsule Take 1 capsule (300 mg total) by mouth at bedtime as needed (sleep).   ??? amoxicillin 500 mg capsule TAKE ONE CAPSULE BY MOUTH THREE TIMES DAILY FOR 7 DAYS   ??? Gabapentin 10 % CREA Take by mouth.   ??? montelukast 10 mg tablet Take 1 tablet (10 mg total) by mouth every evening.   ??? tamoxifen 20 mg tablet 1 tablet   ??? TAMOXIFEN 20 mg tablet TAKE ONE TABLET BY MOUTH EVERY DAY     No facility-administered medications prior to visit.       Allergies:  No Known Allergies    Review of Systems:  A 14 point review of systems was negative besides what I note in my HPI above.     Past Medical History:  Past Medical History:   Diagnosis Date   ??? Breast cancer (HCC/RAF)        Past Surgical History:  Past Surgical History:   Procedure Laterality Date   ??? BREAST FIBROADENOMA SURGERY Bilateral 1992   ??? BREAST LUMPECTOMY Right 12/2018   ??? BREAST LUMPECTOMY Bilateral 01/27/2019   ??? CESAREAN SECTION  2006 & 2008   ??? CHOLECYSTECTOMY  2004   ??? KNEE SURGERY Left 2017    Ligament release    ??? MASTECTOMY Right 03/2019   ??? TONSILECTOMY, ADENOIDECTOMY, BILATERAL MYRINGOTOMY AND TUBES  1983   ??? TUBAL LIGATION  2016       Social History:  Social History     Tobacco Use   ??? Smoking status: Never Smoker   ??? Smokeless tobacco: Never Used   Substance Use Topics   ??? Alcohol use: Not Currently     Family History:  History reviewed. No pertinent family history.     Vitals:  BP: 121/82 (05/02 1519)  Temp: 36.7 ???C (98 ???F) (05/02 1519)  Temp Source: Temporal (05/02 1519)  Heart Rate: 65 (05/02 1519)  Resp: 16 (05/02 1519)  SpO2: 97 % (05/02 1519)  Height: --  Weight: 64.6 kg (142 lb 6.4 oz) (05/02 1519)    Physical Exam:  General: Appears well-developed, well-nourished and close to stated age.   Head: Normocephalic, atraumatic.  Eyes: PERRL without icterus.   ENT: Oropharynx is clear, mucus membranes are moist.    CV: Regular in rate and rhythm, no murmurs or gallops.   Chest: Clear to auscultation bilaterally without wheezing or rhonchi.  Respiratory effort appears normal.   Abdomen: Soft, nontender and nondistended. Bowel sounds are present and normoactive. No organomegaly is appreciated  Musculoskeletal: No edema. No cyanosis. Extremities are warm and well-perfused.   Hematologic: No bruising, purpura or petechiae are noted.   Dermatologic: No rashes appreciated.   Lymphatic: No palpable cervical, supraclavicular, axillary or inguinal adenopathy appreciated.   Psychiatric: Affect appropriate.  Pleasant and conversant  Breast: B/L reconstruction appreciated    Recent Labs:  Lab Results   Component Value Date    NA 143 09/24/2020    K 4.1 09/24/2020    CL 105 09/24/2020    CO2 29 09/24/2020    CREAT 0.74 09/24/2020    BUN 10 09/24/2020    GLUCOSE 97 09/24/2020    CALCIUM 9.0 09/24/2020    MG 1.6 07/03/2019     Lab Results   Component Value Date    WBC 5.5 09/24/2020    HGB 13.5 09/24/2020    HCT 39.8 09/24/2020    MCV 93.0 09/24/2020    PLT 222 09/24/2020    NEUTPCT 69.9 09/24/2020    NEUTABS 3.84 09/24/2020    MONOPCT 6.9 09/24/2020    MONOABS 0.38 09/24/2020     Lab Results   Component Value Date    TOTPRO 6.6 09/24/2020    ALBUMIN 4.5 09/24/2020    BILITOT 0.3 09/24/2020    BILICON <0.2 09/19/2019    AST 36 09/24/2020    ALT 41 09/24/2020    ALKPHOS 53 09/24/2020    GGT 42 09/12/2019     12/13/18: CEA = 0.8, [place] 27.29 = 27    Pertinent Imaging:  Per HPI     Pertinent Pathology:  Per HPI     Impression and Recommendations:  Jenna Newman is a 50 y.o. female with     1. Stage IIB pT2 (3.6 cm) cN1a (1/10 - 8 mm) cM0 RIGHT breast IDC, ER/PR > 95%, HER-2 FISH negative, Ki 67 25%, grade 2/3, SBR 7/9, +LVI, -PNI, and intermediate to high-grade DCIS, ultimately taken to clear margins with August 2020 nipple sparing mastectomy, RS= 16, s/p 4 cycles TC completed Dec 2020, initiating tamoxifen Feb 2021, clinically NED.   2. BIRADS3 RIGHT breast US Jan 2022, repeat in 6 months.  3. Myriad My Risk germline mutation analysis: negative  4. Dense breast tissue - can consider interval MR breast if patient okay with it moving forward, for now, not interested     - reviewed outside breast imaging  - repeat right breast US July 2022, order placed  - reviewed role/rationale for transition to AI from tamoxifen along with side effect profile  - will start arimidex 1 mg daily  - start calcium and vitamin D daily with weight bearing exercise  - will check baseline DEXA later this year  - pending endocrine tolerance, will get BCI moving forward if extended endocrine therapy will be considered    This writer has deemed the above diagnoses to have a risk of complication, morbidity or mortality of:   []  Minimal  []  Low  [x]  Moderate   [x]  due to prescription drug management   [] Decision re: minor surgery   [] Diagnosis or treatment limited by social determinants of health  []  Severe    []  due to intensive monitoring for chemotherapy toxicity   []  Decision elective surgery with risk factors  []  Decision regarding need for hospitalization  []  Advanced Care Planning decisions      Return to clinic:  Return in about 6 months (around 06/01/2021).    Orders:  Orders Placed This Encounter   ??? [name] right breast, diagnostic   ??? DXA lumbar spine+hip   ??? anastrozole 1 mg tablet       Alric Seton. Zoila Shutter, MD  Assistant Professor of Medicine  Division of Hematology/Oncology  Phone: [phone]  Fax: [phone]  Email: [email or web address]    Attending Physician: Alric Seton. Zoila Shutter, MD.  Author: Alric Seton. Zoila Shutter,  MD

## 2020-11-30 ENCOUNTER — Ambulatory Visit: Payer: PRIVATE HEALTH INSURANCE

## 2020-12-01 ENCOUNTER — Telehealth: Payer: PRIVATE HEALTH INSURANCE

## 2020-12-01 NOTE — Telephone Encounter
Dexa & R.Breast US [facility]-Pt to sched               Needing prior to RTN visit 11/2.  Fx not going through, we have e-mailed to [facility]    [email or web address].

## 2020-12-15 ENCOUNTER — Ambulatory Visit: Payer: PRIVATE HEALTH INSURANCE

## 2020-12-15 DIAGNOSIS — Z1211 Encounter for screening for malignant neoplasm of colon: Secondary | ICD-10-CM

## 2020-12-15 DIAGNOSIS — Z5320 Procedure and treatment not carried out because of patient's decision for unspecified reasons: Secondary | ICD-10-CM

## 2020-12-15 DIAGNOSIS — Z1212 Encounter for screening for malignant neoplasm of rectum: Secondary | ICD-10-CM

## 2021-01-13 LAB — Fecal Occult Blood Test: FECAL OCCULT BLOOD IMMUNOASSAY: NEGATIVE

## 2021-01-27 ENCOUNTER — Ambulatory Visit: Payer: PRIVATE HEALTH INSURANCE

## 2021-02-17 ENCOUNTER — Ambulatory Visit: Payer: PRIVATE HEALTH INSURANCE

## 2021-02-18 ENCOUNTER — Telehealth: Payer: PRIVATE HEALTH INSURANCE

## 2021-02-18 MED ORDER — GABAPENTIN 300 MG PO CAPS
300 mg | ORAL_CAPSULE | Freq: Every evening | ORAL | 3 refills | Status: AC | PRN
Start: 2021-02-18 — End: ?

## 2021-02-18 NOTE — Telephone Encounter
2nd Attempted to contact patient , not able to speak to her . Not sure if we have patient incorrect number .   Attempted to contact patients husband left a voice mail     If patient calls back please let her know I was calling in regards to telling her Dr.Whelan has sent over her Rx gabapentin to her [facility] pharmacy she requested thank you .

## 2021-02-18 NOTE — Telephone Encounter
Attempted to contact patient , not able to speak to her . Not sure if we have patient incorrect number .     If patient calls back please let her know I was calling in regards to telling her Dr.Whelan has sent over her Rx gabapentin to her [facility] she requested thank you .

## 2021-03-01 ENCOUNTER — Ambulatory Visit: Payer: PRIVATE HEALTH INSURANCE

## 2021-03-07 ENCOUNTER — Ambulatory Visit: Payer: PRIVATE HEALTH INSURANCE

## 2021-03-09 ENCOUNTER — Telehealth: Payer: PRIVATE HEALTH INSURANCE

## 2021-03-09 ENCOUNTER — Ambulatory Visit: Payer: PRIVATE HEALTH INSURANCE

## 2021-03-09 DIAGNOSIS — Z170 Estrogen receptor positive status [ER+]: Secondary | ICD-10-CM

## 2021-03-09 DIAGNOSIS — M5441 Lumbago with sciatica, right side: Secondary | ICD-10-CM

## 2021-03-09 DIAGNOSIS — G8929 Other chronic pain: Secondary | ICD-10-CM

## 2021-03-09 DIAGNOSIS — C50911 Malignant neoplasm of unspecified site of right female breast: Secondary | ICD-10-CM

## 2021-03-09 NOTE — Progress Notes
PATIENT: Jenna Newman  MRN: [id number]  DOB: 1971-03-12  DATE OF SERVICE: 03/09/2021    CHIEF COMPLAINT: No chief complaint on file.       HPI   Jenna Newman is a 50 y.o. female presents for No chief complaint on file.      ***    MEDS     No outpatient medications have been marked as taking for the 03/09/21 encounter (Telemedicine) with Jacqulyn Liner, DO.       PHYSICAL EXAM    There were no vitals filed for this visit.  There is no height or weight on file to calculate BMI.    System Check if normal Positive or additional negative findings   Constit  [x]  General appearance     Eyes  [x]  Conj/Lids [x]  Pupils  [x]  Fundi     Jenna Newman  []  External ears/nose []  Otoscopy   [x]  Gross Hearing []  Nasal mucosa   []  Lips/teeth/gums []  Oropharynx    []  mucus membranes []  Head     Neck  []  Inspection/palpation []  Thyroid     Resp  [x]  Effort []  Wheezing    []  Auscultation  []  Crackles     CV  [x]  Rhythm/rate   []  Murmurs   []  LEE   []  JVP non-elevated    Normal pulses:   []  Radial []  Femoral  []  Pedal     Breast  []  Inspection []  Palpation     GI  []  abd masses    []  tenderness   []  rebound/guarding   []  Liver/spleen []  Rectal     GU  M: []  Scrotum []  Penis []  Prostate   F:  []  External []  vaginal wall        []  Cervix  []  mucus        []  Uterus    []  Adnexa      Lymph  []  Neck []  Axillae []  Groin     MSK Specify site examined:    []  Inspect/palp []  ROM   []  Stability [x]  Strength/tone         Skin  []  Inspection []  Palpation     Neuro  [x]  CN2-12 intact grossly   [x]  Alert and oriented   []  DTR      [x]  Muscle strength      []  Sensation   [x]  Gait/balance     Psych  [x]  Insight/judgement     [x]  Mood/affect    [x]  Gross cognition        LABS/STUDIES   I have:   []  Reviewed/ordered []  1 []  2 []  ? 3 unique laboratory, radiology, and/or diagnostic tests noted below    []  Reviewed []  1 []  2 []  ? 3 prior external notes and incorporated into patient assessment    []  Discussed management or test interpretation with external provider(s) as noted       Lab Studies:  Office Visit on 09/27/2020   Component Date Value Ref Range Status   ? HIV-1/2 Ag/Ab Screen 4th Generation 09/27/2020 Nonreactive  Nonreactive Final   ? C-Reactive Protein 09/27/2020 <0.3  <0.8 mg/dL Final   ? Sedimentation rate, Erythrocyte 09/27/2020 4  <=25 mm/hr Final   ? Cyclic Citrulline Ab IgG 09/27/2020 2  0 - 19 Units Final    INTERPRETIVE INFORMATION: Cyclic Citrullinated Peptide Antibody,   IgG      19 Units or less ................... Negative    20-39 Units ........................ Weak Positive    40-59  Units .......................Marland Kitchen Moderate Positive    60 Units or greater ................ Strong Positive    Anti-cyclic citrullinated peptide (anti-CCP), IgG antibodies are   present in about 69-83 percent of patients with rheumatoid   arthritis (RA) and have specificities of 93-95 percent. These   autoantibodies may be present in the preclinical phase of disease,   are associated with future RA development, and may predict   radiographic joint destruction. Patients with weak positive   results should be monitored and testing repeated.  Performed By: [facility]  [address]  [place], [place] [address]  Laboratory Director: Smiley Houseman. Greggory Stallion, MD   ? Rheumatoid Factor 09/27/2020 <10  <14 IU/mL Final    NOTE: New Reference Range effective 09/15/2020.     ? Smooth Muscle Ab 09/27/2020 <1:20  <1:20 titer Final    RESULT INTERPRETATION:      <1:20  Negative     >=1:20  Positive         Positive titers are found in active chronic      hepatitis (55%), primary biliary cirrhosis (50%)      and cryptogenic cirrhosis (28%). Smooth muscle      antibodies are also found in viral hepatitis,      infectious mononucleosis, asthma, yellow fever and      malignant tumors.   ? Sm Ab 09/27/2020 <20  <20 U Final   ? RNP Ab 09/27/2020 <20  <20 U Final   ? dsDNA Ab EIA 09/27/2020 <=200  <=200 IU/mL Final   ? Antinuclear Ab 09/27/2020 <1:40  <1:40 titer Final      Antinuclear Antibody performed by IFA method.      ANA is a screening test for detection of autoantibodies  against various cell nuclear proteins. Some of these  antibodies appear to have diagnostic and/or prognostic  significance in Systemic Lupus Erythematosus, progressive  systemic sclerosis, mixed connective tissue disease,  Sjogren's syndrome, polymyositis, and/or rheumatoid  arthritis.  Titers of 1:40 - 1:80 may be found in healthy adults.   ? Fecal Immunochemical Test 01/07/2021 Negative  Negative Final       2018 ACC/AHA guidelines recommends that patient is not in statin benefit group. Encourage adherence to heart-healthy lifestyle.    10-Year ASCVD risk is 0.9% as of 9:11 AM on 03/09/2021.  10-Year ASCVD risk with optimal risk factors is 0.8%.  Values used to calculate ASCVD score:  Age: 50 y.o.   Gender: Female Race: White: Not Listed  HDL cholesterol: 49 mg/dL. HDL cholesterol measured on 03/23/2020.  Total cholesterol: 161 mg/dL. Total cholesterol measured on 03/23/2020.  LDL cholesterol: 99 mg/dL. LDL cholesterol measured on 03/23/2020.  Systolic BP: 121 mm Hg. BP was measured on 11/29/2020.  The patient is not being treated with a medication that influences SBP.  The patient is currently not a smoker.  The patient does not have a diagnosis of diabetes.  Click here for the [facility] ASCVD Cardiovascular Risk Estimator Plus tool Office manager).    Imaging Studies:   No new imaging reviewed today    A&P   Jenna Newman is a 50 y.o. female presenting for No chief complaint on file.        PROBLEM & ORDERS  No diagnosis found.    ASSESSMENT  ***     The above recommendation were discussed with the patient.  The patient has all questions answered satisfactorily and is in agreement with this recommended plan of care.  No follow-ups on file.     Author:  Jacqulyn Liner 03/09/2021 9:11 AM

## 2021-03-10 MED ORDER — CYCLOBENZAPRINE HCL 10 MG PO TABS
5 mg | ORAL_TABLET | Freq: Three times a day (TID) | ORAL | 0 refills | Status: AC | PRN
Start: 2021-03-10 — End: ?

## 2021-03-10 NOTE — Telephone Encounter
Pt would like to get xray order (that was placed on 8/10) done at [facility] and asked for a call back to notify when its changed to external so pt can get order done      Please advise

## 2021-03-10 NOTE — Telephone Encounter
Fax to Fabienne Bruns

## 2021-03-10 NOTE — Progress Notes
PATIENT: Jenna Newman  MRN: [id number]  DOB: 05/23/1971  DATE OF SERVICE: 03/09/2021    CHIEF COMPLAINT: No chief complaint on file.       HPI   Jenna Newman is a 50 y.o. female presents for No chief complaint on file.    A few years of low back pain, on and off, worse past 6 months, numbness down right side. Increasing in frequency despite stretching and yoga.    MEDS     No outpatient medications have been marked as taking for the 03/09/21 encounter (Office Visit) with Jacqulyn Liner, DO.       PHYSICAL EXAM      Last Recorded Vital Signs:    03/09/21 1702   BP: 111/71   Pulse: 73   Temp: 36.9 ?C (98.4 ?F)   SpO2: 97%     Body mass index is 25.69 kg/m?Marland Kitchen    System Check if normal Positive or additional negative findings   Constit  [x]  General appearance     Eyes  [x]  Conj/Lids [x]  Pupils  [x]  Fundi     HENMT  []  External ears/nose []  Otoscopy   [x]  Gross Hearing []  Nasal mucosa   []  Lips/teeth/gums []  Oropharynx    []  mucus membranes []  Head     Neck  []  Inspection/palpation []  Thyroid     Resp  [x]  Effort []  Wheezing    []  Auscultation  []  Crackles     CV  [x]  Rhythm/rate   []  Murmurs   []  LEE   []  JVP non-elevated    Normal pulses:   []  Radial []  Femoral  []  Pedal     Breast  []  Inspection []  Palpation     GI  []  abd masses    []  tenderness   []  rebound/guarding   []  Liver/spleen []  Rectal     GU  M: []  Scrotum []  Penis []  Prostate   F:  []  External []  vaginal wall        []  Cervix  []  mucus        []  Uterus    []  Adnexa      Lymph  []  Neck []  Axillae []  Groin     MSK Specify site examined:    []  Inspect/palp []  ROM   []  Stability [x]  Strength/tone         Skin  []  Inspection []  Palpation     Neuro  [x]  CN2-12 intact grossly   [x]  Alert and oriented   []  DTR      [x]  Muscle strength      []  Sensation   [x]  Gait/balance     Psych  [x]  Insight/judgement     [x]  Mood/affect    [x]  Gross cognition        LABS/STUDIES   I have:   []  Reviewed/ordered []  1 []  2 []  ? 3 unique laboratory, radiology, and/or diagnostic tests noted below    []  Reviewed []  1 []  2 []  ? 3 prior external notes and incorporated into patient assessment    []  Discussed management or test interpretation with external provider(s) as noted       Lab Studies:  Office Visit on 09/27/2020   Component Date Value Ref Range Status   ? HIV-1/2 Ag/Ab Screen 4th Generation 09/27/2020 Nonreactive  Nonreactive Final   ? C-Reactive Protein 09/27/2020 <0.3  <0.8 mg/dL Final   ? Sedimentation rate, Erythrocyte 09/27/2020 4  <=25 mm/hr Final   ? Cyclic Citrulline Ab IgG  09/27/2020 2  0 - 19 Units Final    INTERPRETIVE INFORMATION: Cyclic Citrullinated Peptide Antibody,   IgG      19 Units or less ................... Negative    20-39 Units ........................ Weak Positive    40-59 Units .......................Marland Kitchen Moderate Positive    60 Units or greater ................ Strong Positive    Anti-cyclic citrullinated peptide (anti-CCP), IgG antibodies are   present in about 69-83 percent of patients with rheumatoid   arthritis (RA) and have specificities of 93-95 percent. These   autoantibodies may be present in the preclinical phase of disease,   are associated with future RA development, and may predict   radiographic joint destruction. Patients with weak positive   results should be monitored and testing repeated.  Performed By: [facility]  [address]  [place], [place] [address]  Laboratory Director: Smiley Houseman. Greggory Stallion, MD   ? Rheumatoid Factor 09/27/2020 <10  <14 IU/mL Final    NOTE: New Reference Range effective 09/15/2020.     ? Smooth Muscle Ab 09/27/2020 <1:20  <1:20 titer Final    RESULT INTERPRETATION:      <1:20  Negative     >=1:20  Positive         Positive titers are found in active chronic      hepatitis (55%), primary biliary cirrhosis (50%)      and cryptogenic cirrhosis (28%). Smooth muscle      antibodies are also found in viral hepatitis,      infectious mononucleosis, asthma, yellow fever and      malignant tumors.   ? Sm Ab 09/27/2020 <20  <20 U Final   ? RNP Ab 09/27/2020 <20  <20 U Final   ? dsDNA Ab EIA 09/27/2020 <=200  <=200 IU/mL Final   ? Antinuclear Ab 09/27/2020 <1:40  <1:40 titer Final      Antinuclear Antibody performed by IFA method.      ANA is a screening test for detection of autoantibodies  against various cell nuclear proteins. Some of these  antibodies appear to have diagnostic and/or prognostic  significance in Systemic Lupus Erythematosus, progressive  systemic sclerosis, mixed connective tissue disease,  Sjogren's syndrome, polymyositis, and/or rheumatoid  arthritis.  Titers of 1:40 - 1:80 may be found in healthy adults.   ? Fecal Immunochemical Test 01/07/2021 Negative  Negative Final       2018 ACC/AHA guidelines recommends that patient is not in statin benefit group. Encourage adherence to heart-healthy lifestyle.    10-Year ASCVD risk is 0.7% as of 10:56 AM on 03/10/2021.  10-Year ASCVD risk with optimal risk factors is 0.8%.  Values used to calculate ASCVD score:  Age: 50 y.o.   Gender: Female Race: White: Not Listed  HDL cholesterol: 49 mg/dL. HDL cholesterol measured on 03/23/2020.  Total cholesterol: 161 mg/dL. Total cholesterol measured on 03/23/2020.  LDL cholesterol: 99 mg/dL. LDL cholesterol measured on 03/23/2020.  Systolic BP: 111 mm Hg. BP was measured on 03/09/2021.  The patient is not being treated with a medication that influences SBP.  The patient is currently not a smoker.  The patient does not have a diagnosis of diabetes.  Click here for the [facility] ASCVD Cardiovascular Risk Estimator Plus tool Office manager).    Imaging Studies:   No new imaging reviewed today    A&P   Jenna Newman is a 50 y.o. female presenting for No chief complaint on file.        PROBLEM & ORDERS  ICD-10-CM    1. Chronic midline low back pain with right-sided sciatica  M54.41 MR lumbar spine wo+w contrast    G89.29 cyclobenzaprine 10 mg tablet   2. Malignant neoplasm of right breast in female, estrogen receptor positive, unspecified site of breast (HCC/RAF)  C50.911     Z17.0          ASSESSMENT     Worsening midline back pain in patient with metastatic breast cancer and worsening right sided numbness indicates MRI with contrast to evaluate for possible vertebral malignancy     The above recommendation were discussed with the patient.  The patient has all questions answered satisfactorily and is in agreement with this recommended plan of care.    No follow-ups on file.     Author:  Jacqulyn Liner 03/10/2021 10:56 AM

## 2021-03-11 ENCOUNTER — Ambulatory Visit: Payer: PRIVATE HEALTH INSURANCE

## 2021-03-11 NOTE — Telephone Encounter
fyi

## 2021-03-14 ENCOUNTER — Telehealth: Payer: PRIVATE HEALTH INSURANCE

## 2021-03-14 NOTE — Telephone Encounter
PDL Call to Practice    Reason for Call: emily from [facility] imaging calling to verify we have received authorization from pt insurance for her MRI order    Appointment Related?  '[]'$  Yes  '[x]'$  No     If yes;  Date:  Time:    Call warm transferred to PDL: '[x]'$  Yes  '[]'$  No    Call Received by Practice Representative:LESLIE

## 2021-03-14 NOTE — Telephone Encounter
Advice Jenna Newman should be getting auth for order

## 2021-04-05 ENCOUNTER — Ambulatory Visit: Payer: PRIVATE HEALTH INSURANCE

## 2021-04-06 ENCOUNTER — Telehealth: Payer: PRIVATE HEALTH INSURANCE

## 2021-04-06 DIAGNOSIS — M5441 Lumbago with sciatica, right side: Secondary | ICD-10-CM

## 2021-04-06 DIAGNOSIS — G8929 Other chronic pain: Secondary | ICD-10-CM

## 2021-04-06 NOTE — Progress Notes
PATIENT: Jenna Newman  MRN: [id number]  DOB: [birth year]  DATE OF SERVICE: 04/06/2021    CHIEF COMPLAINT: No chief complaint on file.       HPI   Jenna Newman is a 50 y.o. female presents for No chief complaint on file.      Several month recurrent low back pain   Radiates acutely down right leg, worsening on frequency    MEDS     No outpatient medications have been marked as taking for the 04/06/21 encounter (Telemedicine) with Jacqulyn Liner, DO.       PHYSICAL EXAM    There were no vitals filed for this visit.  There is no height or weight on file to calculate BMI.    System Check if normal Positive or additional negative findings   Constit  [x]  General appearance     Eyes  [x]  Conj/Lids [x]  Pupils  [x]  Fundi     HENMT  []  External ears/nose []  Otoscopy   [x]  Gross Hearing []  Nasal mucosa   []  Lips/teeth/gums []  Oropharynx    []  mucus membranes []  Head     Neck  []  Inspection/palpation []  Thyroid     Resp  [x]  Effort []  Wheezing    []  Auscultation  []  Crackles     CV  [x]  Rhythm/rate   []  Murmurs   []  LEE   []  JVP non-elevated    Normal pulses:   []  Radial []  Femoral  []  Pedal     Breast  []  Inspection []  Palpation     GI  []  abd masses    []  tenderness   []  rebound/guarding   []  Liver/spleen []  Rectal     GU  M: []  Scrotum []  Penis []  Prostate   F:  []  External []  vaginal wall        []  Cervix  []  mucus        []  Uterus    []  Adnexa      Lymph  []  Neck []  Axillae []  Groin     MSK Specify site examined:    []  Inspect/palp []  ROM   []  Stability [x]  Strength/tone         Skin  []  Inspection []  Palpation     Neuro  [x]  CN2-12 intact grossly   [x]  Alert and oriented   []  DTR      [x]  Muscle strength      []  Sensation   [x]  Gait/balance     Psych  [x]  Insight/judgement     [x]  Mood/affect    [x]  Gross cognition        LABS/STUDIES   I have:   []  Reviewed/ordered []  1 []  2 []  ? 3 unique laboratory, radiology, and/or diagnostic tests noted below    []  Reviewed []  1 []  2 []  ? 3 prior external notes and incorporated into patient assessment    []  Discussed management or test interpretation with external provider(s) as noted       Lab Studies:  Office Visit on 09/27/2020   Component Date Value Ref Range Status   ? HIV-1/2 Ag/Ab Screen 4th Generation 09/27/2020 Nonreactive  Nonreactive Final   ? C-Reactive Protein 09/27/2020 <0.3  <0.8 mg/dL Final   ? Sedimentation rate, Erythrocyte 09/27/2020 4  <=25 mm/hr Final   ? Cyclic Citrulline Ab IgG 09/27/2020 2  0 - 19 Units Final    INTERPRETIVE INFORMATION: Cyclic Citrullinated Peptide Antibody,   IgG      19 Units or  less ................... Negative    20-39 Units ........................ Weak Positive    40-59 Units .......................Marland Kitchen Moderate Positive    60 Units or greater ................ Strong Positive    Anti-cyclic citrullinated peptide (anti-CCP), IgG antibodies are   present in about 69-83 percent of patients with rheumatoid   arthritis (RA) and have specificities of 93-95 percent. These   autoantibodies may be present in the preclinical phase of disease,   are associated with future RA development, and may predict   radiographic joint destruction. Patients with weak positive   results should be monitored and testing repeated.  Performed By: [facility]  [address]  [place], [place] [address]  Laboratory Director: Smiley Houseman. Greggory Stallion, MD   ? Rheumatoid Factor 09/27/2020 <10  <14 IU/mL Final    NOTE: New Reference Range effective 09/15/2020.     ? Smooth Muscle Ab 09/27/2020 <1:20  <1:20 titer Final    RESULT INTERPRETATION:      <1:20  Negative     >=1:20  Positive         Positive titers are found in active chronic      hepatitis (55%), primary biliary cirrhosis (50%)      and cryptogenic cirrhosis (28%). Smooth muscle      antibodies are also found in viral hepatitis,      infectious mononucleosis, asthma, yellow fever and      malignant tumors.   ? Sm Ab 09/27/2020 <20  <20 U Final   ? RNP Ab 09/27/2020 <20  <20 U Final   ? dsDNA Ab EIA 09/27/2020 <=200  <=200 IU/mL Final   ? Antinuclear Ab 09/27/2020 <1:40  <1:40 titer Final      Antinuclear Antibody performed by IFA method.      ANA is a screening test for detection of autoantibodies  against various cell nuclear proteins. Some of these  antibodies appear to have diagnostic and/or prognostic  significance in Systemic Lupus Erythematosus, progressive  systemic sclerosis, mixed connective tissue disease,  Sjogren's syndrome, polymyositis, and/or rheumatoid  arthritis.  Titers of 1:40 - 1:80 may be found in healthy adults.   ? Fecal Immunochemical Test 01/07/2021 Negative  Negative Final       2018 ACC/AHA guidelines recommends that patient is not in statin benefit group. Encourage adherence to heart-healthy lifestyle.    10-Year ASCVD risk is 0.7% as of 10:46 AM on 04/06/2021.  10-Year ASCVD risk with optimal risk factors is 0.8%.  Values used to calculate ASCVD score:  Age: 50 y.o.   Gender: Female Race: White: Not Listed  HDL cholesterol: 49 mg/dL. HDL cholesterol measured on 03/23/2020.  Total cholesterol: 161 mg/dL. Total cholesterol measured on 03/23/2020.  LDL cholesterol: 99 mg/dL. LDL cholesterol measured on 03/23/2020.  Systolic BP: 111 mm Hg. BP was measured on 03/09/2021.  The patient is not being treated with a medication that influences SBP.  The patient is currently not a smoker.  The patient does not have a diagnosis of diabetes.  Click here for the [facility] ASCVD Cardiovascular Risk Estimator Plus tool Office manager).    Imaging Studies:       A&P   Jenna Newman is a 50 y.o. female presenting for No chief complaint on file.        PROBLEM & ORDERS    ICD-10-CM    1. Chronic bilateral low back pain with right-sided sciatica  M54.41     G89.29        ASSESSMENT  To follow up Ortho to discuss treatment options including possible physical therapy    As needed tylenol/ibuprofen OK    There are no Patient Instructions on file for this visit.      The above recommendation were discussed with the patient.  The patient has all questions answered satisfactorily and is in agreement with this recommended plan of care.    No follow-ups on file.     Author:  Jacqulyn Liner 04/06/2021 10:46 AM

## 2021-05-18 NOTE — Telephone Encounter
Dexa 9/1 & R.Breast US GMI 8/30           Pt scheduled Apt Confirmed with Margie at [facility]

## 2021-05-27 ENCOUNTER — Ambulatory Visit: Payer: PRIVATE HEALTH INSURANCE

## 2021-05-27 NOTE — Telephone Encounter
Pt declined telehealth visit due to not wanting to pay cost of visit. Pt advised MD will not be able to send in rx without visit, pt agreed she is fine without rx.

## 2021-06-01 ENCOUNTER — Ambulatory Visit: Payer: PRIVATE HEALTH INSURANCE | Attending: Hematology & Oncology

## 2021-06-01 DIAGNOSIS — C50911 Malignant neoplasm of unspecified site of right female breast: Secondary | ICD-10-CM

## 2021-06-01 DIAGNOSIS — Z170 Estrogen receptor positive status [ER+]: Secondary | ICD-10-CM

## 2021-06-01 NOTE — Progress Notes
Patient name:  Jenna Newman  MRN:  [id number]  DOB:  [birth year]  CSN: [id number]  Date of encounter: 06/01/2021  Referring provider: No ref. provider found  PCP: Jacqulyn Liner, DO  Provider: Alric Seton. 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     Symptomatically, her quality of life has improved dramatically since discontinuing endocrine therapy. She does have some trepidation understanding the risk of breast cancer recurrence is higher, but ultimately, is pleased with decision given lifestyle improvements.     No fevers, sweats, chills. No nausea or vomiting. No new breast mass, nodule, color changes, nipple discharge. No cardiopulmonary complaints, no bone pain.    ONCOLOGY HISTORY:   11/27/18 b/l dx tomosynthesis mammo at [facility]: heterogeneously dense breast tissue. Distortion in the RUO breast peri-areolar region without suspicious calcifications.   11/27/18 RIGHT breast US: 15 x 16 x 22 mm mass at 11:00, 6 cm from the nipple, with a 1.4 cm extension of the mass off the superior aspect appearing continuous by a thin bridge of tissue. No frank right axillary adenopathy  12/06/18 US guided right breast bx: IDC, 8 mm, grade 2/3, SBR 7/9, ER/PR > 95%, Ki 67 25%, HER-2 FISH negative, with background grade 3 DCIS, cribriform pattern. No LCIS, no LVI, no microcalcifications  12/25/18 Genetics consultation: Myriad My Risk germline negative.   12/25/18 MRI breasts at [facility]: 2.8 x 2.1 x 2.2 cm mass at 11:00 axi right breast middle to anterior depth 5 cm from nipple, no e/o multi focal or multi centric or contralateral disease. No e/o nodal or metastatic dz seen.   12/31/18 Dr. Shanda Howells Surgical Oncology consult  01/27/19 RIGHT lumpectomy/SLN bx and LEFT breast reduction mammoplasty:   RIGHT: 3.6 cm IDC, grade 2/3, MBR 6/9, +LVI, no PNI, with 1 of 2 sentinel nodes positive for 8 mm metastatic cancer, with 3.5 cm DCIS, intermediate grade, comedo-type, cribriform and solid, with focally + CIS margins at superior/medial, invasive carcinoma margins uninvolved  LEFT: benign, no in situ or invasive dz.   02/17/19 RIGHT breast re-excision: UDH, apocrine metaplasia, no residual invasive carcinoma, focal residual DCIS measuring 16 mm, 0.2 mm from new lateral margin.   03/17/19 RIGHT nipple sparing mastectomy and level 1/2 ax node dissection with reconstruction (Dr. Renaldo Reel): focal, residual 3 mm DCIS, 6 mm from closest margin, no residual invasive cancer, ADH free of margins by 1.3 cm, 8 axillary nodes removed, all negative.   05/01/19 - 07/03/19 4 cycles TC adjuvant chemotherapy  06/20/19 [facility] at [facility] given complaints of bone pain: negative  09/25/19 CT chest: negative  09/25/19 CT AP: negative  11/14/19 LEFT mammogram: heterogeneously dense, benign - BIRADS2  01/22/20 MR breast [facility]: Focal linear soft tissue and implant capsule thickening and enhancement along the inferior lateral aspect of the right breast implant. Probably benign  and may represent residual postoperative change, but   second-look ultrasound is recommended.  02/23/20 RIGHT breast US: BIRADS 3--PROBABLY BENIGN: Right breast implant in patient with prior mastectomy. In the right lateral breast/chest wall, there are multiple small cystic structures identified, ranging in size from 2 mm to 8 x 6 mm. These appear like anechoic cystic spaces on real-time ultrasound. This may represent postsurgical change in patient with prior complete mastectomy. Recommend six-month follow-up ultrasound to assure stability or resolution.  08/09/20 Right breast US: multiple cysts, BIRADS3 (probably benign), with recommendation for follow up ultrasound in 6 months.  11/22/20 B/L mammogram with dense breast tissue, otherwise benign.  03/29/21 [name] RIGHT breast: BIRADS3 - probably benign, recommend 6 month f/u ultrasound   03/31/21 DEXA: normal, T-score -0.5    ECOG performance status: 0    Objective:  Current medications:  Outpatient Medications Prior to Visit   Medication Sig   ? cyclobenzaprine 10 mg tablet Take 0.5 tablets (5 mg total) by mouth every eight (8) hours as needed for Muscle spasms.   ? anastrozole 1 mg tablet Take 1 tablet (1 mg total) by mouth daily.   ? gabapentin 300 mg capsule Take 1 capsule (300 mg total) by mouth at bedtime as needed (sleep).     No facility-administered medications prior to visit.       Allergies:  No Known Allergies    Review of Systems:  A 14 point review of systems was negative besides what I note in my HPI above.     Past Medical History:  Past Medical History:   Diagnosis Date   ? Breast cancer (HCC/RAF)        Past Surgical History:  Past Surgical History:   Procedure Laterality Date   ? BREAST FIBROADENOMA SURGERY Bilateral 1992   ? BREAST LUMPECTOMY Right 12/2018   ? BREAST LUMPECTOMY Bilateral 01/27/2019   ? CESAREAN SECTION  2006 & 2008   ? CHOLECYSTECTOMY  2004   ? KNEE SURGERY Left 2017    Ligament release    ? MASTECTOMY Right 03/2019   ? TONSILECTOMY, ADENOIDECTOMY, BILATERAL MYRINGOTOMY AND TUBES  1983   ? TUBAL LIGATION  2016       Social History:  Social History     Tobacco Use   ? Smoking status: Never Smoker   ? Smokeless tobacco: Never Used   Substance Use Topics   ? Alcohol use: Not Currently     Family History:  No family history on file.     Vitals:  BP: 115/81 (11/02 1408)  Temp: 36.6 ?C (97.8 ?F) (11/02 1408)  Temp Source: Temporal (11/02 1408)  Heart Rate: 78 (11/02 1408)  Resp: 18 (11/02 1408)  SpO2: 98 % (11/02 1408)  Height: --  Weight: 65.5 kg (144 lb 6.4 oz) (11/02 1408)    Physical Exam:  General: Appears well-developed, well-nourished and close to stated age.   Head: Normocephalic, atraumatic.  Eyes: PERRL without icterus.   ENT: Oropharynx is clear, mucus membranes are moist.    CV: Regular in rate and rhythm, no murmurs or gallops.   Chest: Clear to auscultation bilaterally without wheezing or rhonchi.  Respiratory effort appears normal.   Abdomen: Soft, nontender and nondistended. Bowel sounds are present and normoactive. No organomegaly is appreciated  Musculoskeletal: No edema. No cyanosis. Extremities are warm and well-perfused.   Hematologic: No bruising, purpura or petechiae are noted.   Dermatologic: No rashes appreciated.   Lymphatic: No palpable cervical, supraclavicular, axillary or  inguinal adenopathy appreciated.   Psychiatric: Affect appropriate.  Pleasant and conversant  Breast: B/L reconstruction appreciated, no abnormal findings (chaperone present during exam)    Recent Labs:  Lab Results   Component Value Date    NA 143 09/24/2020    K 4.1 09/24/2020    CL 105 09/24/2020    CO2 29 09/24/2020    CREAT 0.74 09/24/2020    BUN 10 09/24/2020    GLUCOSE 97 09/24/2020    CALCIUM 9.0 09/24/2020    MG 1.6 07/03/2019     Lab Results   Component Value Date    WBC 9.9 06/01/2021    HGB 14.1 06/01/2021    HCT 41.5 06/01/2021    MCV 91.8 06/01/2021    PLT 234 06/01/2021    NEUTPCT 76.7 (H) 06/01/2021    NEUTABS 7.57 (H) 06/01/2021    MONOPCT 6.5 06/01/2021    MONOABS 0.64 06/01/2021     Lab Results   Component Value Date    TOTPRO 6.6 09/24/2020    ALBUMIN 4.5 09/24/2020    BILITOT 0.3 09/24/2020    BILICON <0.2 09/19/2019    AST 36 09/24/2020    ALT 41 09/24/2020    ALKPHOS 53 09/24/2020    GGT 42 09/12/2019     12/13/18: CEA = 0.8, [place] 27.29 = 27    Pertinent Imaging:  Per HPI     Pertinent Pathology:  Per HPI     Impression and Recommendations:  Jenna Newman is a 50 y.o. female with     1. 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   2. BIRADS3 RIGHT breast US August 2022, repeat in 6 months.  3. Myriad My Risk germline mutation analysis: negative  4. Dense breast tissue - consider interval MR breast if patient okay with it moving forward    - check labs today  - reviewed breast imaging  - repeat right breast US April 2023  - repeat left breast mammo April 2023      MEDICAL PROBLEMS       []  New minor or self-limited problem (2).  [x]  2+ minor or self-limited problem, stable chronic illness, acute uncomplicated illness/injury (3).  []  Chronic illness with exacerbation/progression/tx side effect; 2+ stable chronic illnesses, new problem with uncertain prognosis, acute illness with systemic symptoms, or acute complicated injury (4).  []  Chronic illness with severe exacerbation/progression/tx side effect, acute/chronic illness or injury that poses threat to life or bodily function (5).        REVIEW OF DATA       []  Reviewed/ordered []  1 [x]  2 []  ? 3 unique laboratory, radiology, and/or diagnostic tests      []  Reviewed []  1 []  2 []  ? 3 prior external notes and incorporated into patient assessment      []  Independent Test Interpretation or discussed management with external provider(s) on   As noted   RISK OF COMPLICATION      This writer has deemed the above diagnoses to have a risk of complication, morbidity or mortality of:   []  Minimal  []  Low  []  Moderate   []   Due to prescription drug management   []  Decision re: minor surgery/procedure   []  Diagnosis or treatment limited by social determinants of health  []  High    []  Due to intensive monitoring for medication/chemotherapy toxicity   []  Decision elective surgery with risk factors  []  Decision regarding need for hospitalization  []  Advanced Care Planning decisions     Return to clinic:  Return in about 23 weeks (around 11/09/2021) for see MD and review scans.    Orders:  Orders Placed This Encounter   ? Mammogram, diagnostic, left breast   ? [name] right breast, diagnostic   ? CA27.29   ? CEA   ? Comprehensive Metabolic Panel   ? CBC & Auto Differential       Alric Seton. Zoila Shutter, MD  Assistant Professor of Medicine  Division of Hematology/Oncology  Phone: [phone]  Fax: [phone]  Email: [email or web address]    Attending Physician: Alric Seton. Zoila Shutter, MD.  Author: Alric Seton. Zoila Shutter, MD

## 2021-06-01 NOTE — Patient Instructions
Please have your breast imaging a couple days before your return visit

## 2021-06-02 LAB — Comprehensive Metabolic Panel
ANION GAP: 9 mmol/L (ref 8–19)
CHLORIDE: 101 mmol/L (ref 96–106)

## 2021-06-02 LAB — CEA: CARCINOEMBRYONIC ANTIGEN: 0.9 ng/mL (ref <3.1)

## 2021-06-04 LAB — Cancer Antigen 27.29: CANCER ANTIGEN 27.29: 23.6 U/mL (ref <=39.0)

## 2021-06-06 ENCOUNTER — Telehealth: Payer: PRIVATE HEALTH INSURANCE

## 2021-06-07 NOTE — Telephone Encounter
Mammo/US Fx to [facility] 11/7 Pt to sched       Pt was advised to calll [facility] to get exam scheduled as requested by MD .    [phone]  [phone]

## 2021-11-09 ENCOUNTER — Ambulatory Visit: Payer: PRIVATE HEALTH INSURANCE | Attending: Hematology & Oncology

## 2021-11-09 DIAGNOSIS — Z170 Estrogen receptor positive status [ER+]: Secondary | ICD-10-CM

## 2021-11-09 DIAGNOSIS — C50911 Malignant neoplasm of unspecified site of right female breast: Secondary | ICD-10-CM

## 2021-11-09 NOTE — Patient Instructions
Please have your labs and breast ultrasound a few days before your return visit

## 2021-11-09 NOTE — Progress Notes
Patient name:  Jenna Newman  MRN:  [id number]  DOB:  [birth year]  CSN: [id number]  Date of encounter: 11/09/2021  Referring provider: No ref. provider found  PCP: Jacqulyn Liner, DO  Provider: Alric Seton. 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 Quality of life markedly improved after stopping endocrine agent.    Had surveillance breast imaging last month, as can be seen below, BIRADS3, recommend repeat ultrasound 6 months.    Jenna Newman is doing well clinically. She does have some right sided post breast construction discomfort, but for now, without plans for revision. Does have back pain but this improved dramatically after recent epidural.     No fevers, sweats, chills. No nausea or vomiting. No new breast mass, nodule, color changes, nipple discharge. No cardiopulmonary complaints, no bone pain.    ONCOLOGY HISTORY:   11/27/18 b/l dx tomosynthesis mammo at [facility]: heterogeneously dense breast tissue. Distortion in the RUO breast peri-areolar region without suspicious calcifications.   11/27/18 RIGHT breast US: 15 x 16 x 22 mm mass at 11:00, 6 cm from the nipple, with a 1.4 cm extension of the mass off the superior aspect appearing continuous by a thin bridge of tissue. No frank right axillary adenopathy  12/06/18 US guided right breast bx: IDC, 8 mm, grade 2/3, SBR 7/9, ER/PR > 95%, Ki 67 25%, HER-2 FISH negative, with background grade 3 DCIS, cribriform pattern. No LCIS, no LVI, no microcalcifications  12/25/18 Genetics consultation: Myriad My Risk germline negative.   12/25/18 MRI breasts at [facility]: 2.8 x 2.1 x 2.2 cm mass at 11:00 axi right breast middle to anterior depth 5 cm from nipple, no e/o multi focal or multi centric or contralateral disease. No e/o nodal or metastatic dz seen.   12/31/18 Dr. Shanda Howells Surgical Oncology consult  01/27/19 RIGHT lumpectomy/SLN bx and LEFT breast reduction mammoplasty:   RIGHT: 3.6 cm IDC, grade 2/3, MBR 6/9, +LVI, no PNI, with 1 of 2 sentinel nodes positive for 8 mm metastatic cancer, with 3.5 cm DCIS, intermediate grade, comedo-type, cribriform and solid, with focally + CIS margins at superior/medial, invasive carcinoma margins uninvolved  LEFT: benign, no in situ or invasive dz.   02/17/19 RIGHT breast re-excision: UDH, apocrine metaplasia, no residual invasive carcinoma, focal residual DCIS measuring 16 mm, 0.2 mm from new lateral margin.   03/17/19 RIGHT nipple sparing mastectomy and level 1/2 ax node dissection with reconstruction (Dr. Renaldo Reel): focal, residual 3 mm DCIS, 6 mm from closest margin, no residual invasive cancer, ADH free of margins by 1.3 cm, 8 axillary nodes removed, all negative.   05/01/19 - 07/03/19 4 cycles TC adjuvant chemotherapy  06/20/19 [facility] at [facility] given complaints of bone pain: negative  09/25/19 CT chest: negative  09/25/19 CT AP: negative  11/14/19 LEFT mammogram: heterogeneously dense, benign - BIRADS2  01/22/20 MR breast [facility]: Focal linear soft tissue and implant capsule thickening and enhancement along the inferior lateral aspect of the right breast implant. Probably benign and may represent residual postoperative change, but   second-look ultrasound is recommended.  02/23/20 RIGHT breast US: BIRADS 3--PROBABLY BENIGN: Right breast implant in patient with prior mastectomy. In the right lateral breast/chest wall, there are multiple small cystic structures identified, ranging in size from 2 mm to 8 x 6 mm. These appear like anechoic cystic spaces on real-time ultrasound. This may represent postsurgical change in patient with prior complete mastectomy. Recommend six-month follow-up ultrasound to assure stability or resolution.  08/09/20 Right breast US: multiple cysts, BIRADS3 (probably benign), with recommendation for follow up ultrasound in 6 months.  11/22/20 B/L mammogram with dense breast tissue, otherwise benign.  03/29/21 [name] RIGHT breast: BIRADS3 - probably benign, recommend 6 month f/u ultrasound   03/31/21 DEXA: normal, T-score -0.5  10/10/21 Dx left breast mammo and right breast US at [facility]: BIRADS3, probably benign, 7 x 6 x 8 mm cyst at 1:00 right breast 9 cm from nipple, 5 x 4 mm cyst at 11:00 right breast 9 cm from nipple    ECOG performance status: 0    Objective:  Current medications:  Outpatient Medications Prior to Visit   Medication Sig   ? cyclobenzaprine 10 mg tablet Take 0.5 tablets (5 mg total) by mouth every eight (8) hours as needed for Muscle spasms.     No facility-administered medications prior to visit.       Allergies:  No Known Allergies    Review of Systems:  A 14 point review of systems was negative besides what I note in my HPI above.     Past Medical History:  Past Medical History:   Diagnosis Date   ? Breast cancer (HCC/RAF)        Past Surgical History:  Past Surgical History:   Procedure Laterality Date   ? BREAST FIBROADENOMA SURGERY Bilateral 1992   ? BREAST LUMPECTOMY Right 12/2018   ? BREAST LUMPECTOMY Bilateral 01/27/2019   ? CESAREAN SECTION  2006 & 2008   ? CHOLECYSTECTOMY  2004   ? KNEE SURGERY Left 2017    Ligament release    ? MASTECTOMY Right 03/2019   ? TONSILECTOMY, ADENOIDECTOMY, BILATERAL MYRINGOTOMY AND TUBES  1983   ? TUBAL LIGATION  2016       Social History:  Social History     Tobacco Use   ? Smoking status: Never   ? Smokeless tobacco: Never   Substance Use Topics   ? Alcohol use: Not Currently     Family History:  No family history on file.     Vitals:  BP: 121/75 (04/12 1505)  Temp: 36.4 ?C (97.5 ?F) (04/12 1505)  Temp Source: Oral (04/12 1505)  Heart Rate: 72 (04/12 1505)  Resp: 18 (04/12 1505)  SpO2: 98 % (04/12 1505)  Height: --  Weight: 67 kg (147 lb 12.8 oz) (04/12 1505)    Physical Exam:  General: Appears well-developed, well-nourished and close to stated age.   Head: Normocephalic, atraumatic.  Eyes: PERRL without icterus.   ENT: Oropharynx is clear, mucus membranes are moist.    CV: Regular in rate and rhythm, no murmurs or gallops.   Chest: Clear to auscultation bilaterally without wheezing or rhonchi.  Respiratory effort appears normal.   Abdomen: Soft, nontender and nondistended. Bowel sounds are present and normoactive. No organomegaly is appreciated  Musculoskeletal: No edema. No  cyanosis. Extremities are warm and well-perfused.   Hematologic: No bruising, purpura or petechiae are noted.   Dermatologic: No rashes appreciated.   Lymphatic: No palpable cervical, supraclavicular, axillary or inguinal adenopathy appreciated.   Psychiatric: Affect appropriate.  Pleasant and conversant  Breast: B/L reconstruction appreciated, no abnormal findings     Recent Labs:  Lab Results   Component Value Date    NA 139 06/01/2021    K 3.9 06/01/2021    CL 101 06/01/2021    CO2 29 06/01/2021    CREAT 0.81 06/01/2021    BUN 8 06/01/2021    GLUCOSE 108 (H) 06/01/2021    CALCIUM 9.8 06/01/2021    MG 1.6 07/03/2019     Lab Results   Component Value Date    WBC 9.9 06/01/2021    HGB 14.1 06/01/2021    HCT 41.5 06/01/2021    MCV 91.8 06/01/2021    PLT 234 06/01/2021    NEUTPCT 76.7 (H) 06/01/2021    NEUTABS 7.57 (H) 06/01/2021    MONOPCT 6.5 06/01/2021    MONOABS 0.64 06/01/2021     Lab Results   Component Value Date    TOTPRO 7.3 06/01/2021    ALBUMIN 5.1 (H) 06/01/2021    BILITOT 0.3 06/01/2021    BILICON <0.2 09/19/2019    AST 45 06/01/2021    ALT 58 06/01/2021    ALKPHOS 96 06/01/2021    GGT 42 09/12/2019     12/13/18: CEA = 0.8, [place] 27.29 = 27    Pertinent Imaging:  Per HPI     Pertinent Pathology:  Per HPI     Impression and Recommendations:  ADILENE AREOLA is a 51 y.o. female with     1. 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   2. BIRADS3 RIGHT breast US March 2023, repeat in 6 months.  3. Myriad My Risk germline mutation analysis: negative  4. Dense breast tissue - discussed MR vs interval [name], etc, plan for b/l [name] in 6 months    - reviewed breast imaging, copy of reports provided for Jenna Newman's records  - repeat breast US in 6 months, again, given dense breast tissue, have considered interval MR breast  - repeat labs in 6 months    I spent 30 minutes counseling and coordinating care for the items checked below on the day of service  [x]  Preparing to see the patient (e.g., review of tests)  [x]  Obtaining and or reviewing separately obtained history   [x]  Performing a medically appropriate examination and/or evaluation   [x]  Counseling and educating the patient/family/caregiver   [x]  Ordering medications, tests, or procedures  []  Referring and communicating with other healthcare professionals (when not separately reported)  [x]  Documenting clinical information in the EHR  [x]  Independently interpreting results and communicating results to patient/ family/caregiver     The above recommendation were discussed with the patient. The patient has all questions answered satisfactorily and is in agreement with this recommended plan of care.     Return to clinic:  Return in about 6 months (around 05/11/2022) for see MD and review scans, see MD  and review labs.    Orders:  Orders Placed This Encounter   ? [name] left breast, screening   ? [name] right breast, screening   ? Comprehensive Metabolic Panel   ? CBC & Auto Differential   ? CEA   ? CA27.29       Alric Seton. Zoila Shutter, MD  Assistant Professor of Medicine  Division of Hematology/Oncology  Phone: [phone]  Fax: [phone]  Email: [email or web address]    Attending Physician: Alric Seton. Zoila Shutter, MD.  Author: Alric Seton. Zoila Shutter, MD

## 2021-11-10 ENCOUNTER — Telehealth: Payer: PRIVATE HEALTH INSURANCE

## 2021-11-10 NOTE — Telephone Encounter
Referral faxed  Pt was advised facility would call to sched apt.  PT advised If facility does not contact pt to sched to please reach out.   PT advised scan would need to be done prior to RTN    [phone]

## 2022-04-12 NOTE — Telephone Encounter
I called [facility] and spoke with [place]. She said the pt is scheduled for a diagnostic mammo + [name] R Breast next Monday 9/18. I asked if she knows why the pt was scheduled as such, since the orders were for screening US L + R Breast. Joanna said that she personally didn't know, but she could transfer me to the [facility]coordinator''. I agreed. She transferred me to the extension for [facility], but I did not get an answer.     I called [facility] again and this time spoke with [place]. She offered to transfer me to either Katharine Look or [place]. I told her I was already transferred to [place], but I did not get an answer. I agreed to be transferred to [facility]. Margreta Journey tried transferring me, but Colletta [place] was also unavailable.     Margreta Journey said that she will send a message to [facility] as well as the supervisor and have someone give me a call back to discuss further. I provided Margreta Journey with our office phone number as well as my name. She understood.

## 2022-04-12 NOTE — Telephone Encounter
Dois Davenport called me back and we spoke. She said that they never do screening mammos there, so that is why the pt was scheduled for a diagnostic mammo. I asked why the pt was scheduled for right side only (when the orders were for left and right side). She said that the order for the [name] L Breast said: ''h/o breast ca, dense breast tissue'', and again, they do not perform screening breast imaging. She said that after the pt's last mammo on 3/13, the radiologist's recommendation was:     ''Short term follow-up ultrasound right breast in 6 months.''    Per their protocol, they only schedule based on the radiologist's recommendations, not based on the the ordering provider's orders.     Dr. Elvera Lennox, please see above and below FYI.

## 2022-04-18 ENCOUNTER — Ambulatory Visit: Payer: PRIVATE HEALTH INSURANCE

## 2022-04-21 ENCOUNTER — Ambulatory Visit: Payer: PRIVATE HEALTH INSURANCE

## 2022-05-12 ENCOUNTER — Telehealth: Payer: PRIVATE HEALTH INSURANCE

## 2022-05-12 ENCOUNTER — Ambulatory Visit: Payer: PRIVATE HEALTH INSURANCE

## 2022-05-12 ENCOUNTER — Ambulatory Visit: Payer: PRIVATE HEALTH INSURANCE | Attending: Hematology & Oncology

## 2022-05-12 DIAGNOSIS — Z170 Estrogen receptor positive status [ER+]: Secondary | ICD-10-CM

## 2022-05-12 DIAGNOSIS — G629 Polyneuropathy, unspecified: Secondary | ICD-10-CM

## 2022-05-12 DIAGNOSIS — C50911 Malignant neoplasm of unspecified site of right female breast: Secondary | ICD-10-CM

## 2022-05-12 MED ORDER — GABAPENTIN 300 MG PO CAPS
300 mg | ORAL_CAPSULE | Freq: Every evening | ORAL | 3 refills | Status: AC
Start: 2022-05-12 — End: ?

## 2022-05-12 NOTE — Patient Instructions
Please have your mammogram a couple days before your return visit

## 2022-05-12 NOTE — Telephone Encounter
10/13-Mammo, diag lt breast    -All forms faxed to [facility]  Fx: [phone]  Ph: [phone]    -Pt aware to sched in 10/2022 LB

## 2022-05-12 NOTE — Progress Notes
Patient name:  Jenna Newman  MRN:  [id number]  DOB:  10/21/1970  CSN: [id number]  Date of encounter: 05/12/2022  Referring provider: No ref. provider found  PCP: Jacqulyn Liner, DO  Provider: Alric Seton. 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 Quality of life markedly improved after stopping endocrine agent.    Had repeat right breast US last month, as noted below - negative.     Jenna Newman is doing great. Eating well. Yoga about 4 times per week. No fevers, sweats, chills. No nausea or vomiting. No new breast mass, nodule, color changes, nipple discharge. No cardiopulmonary complaints, no bone pain.    ONCOLOGY HISTORY:   11/27/18 b/l dx tomosynthesis mammo at [facility]: heterogeneously dense breast tissue. Distortion in the RUO breast peri-areolar region without suspicious calcifications.   11/27/18 RIGHT breast US: 15 x 16 x 22 mm mass at 11:00, 6 cm from the nipple, with a 1.4 cm extension of the mass off the superior aspect appearing continuous by a thin bridge of tissue. No frank right axillary adenopathy  12/06/18 US guided right breast bx: IDC, 8 mm, grade 2/3, SBR 7/9, ER/PR > 95%, Ki 67 25%, HER-2 FISH negative, with background grade 3 DCIS, cribriform pattern. No LCIS, no LVI, no microcalcifications  12/25/18 Genetics consultation: Myriad My Risk germline negative.   12/25/18 MRI breasts at [facility]: 2.8 x 2.1 x 2.2 cm mass at 11:00 axi right breast middle to anterior depth 5 cm from nipple, no e/o multi focal or multi centric or contralateral disease. No e/o nodal or metastatic dz seen.   12/31/18 Dr. Shanda Howells Surgical Oncology consult  01/27/19 RIGHT lumpectomy/SLN bx and LEFT breast reduction mammoplasty:   RIGHT: 3.6 cm IDC, grade 2/3, MBR 6/9, +LVI, no PNI, with 1 of 2 sentinel nodes positive for 8 mm metastatic cancer, with 3.5 cm DCIS, intermediate grade, comedo-type, cribriform and solid, with focally + CIS margins at superior/medial, invasive carcinoma margins uninvolved  LEFT: benign, no in situ or invasive dz.   02/17/19 RIGHT breast re-excision: UDH, apocrine metaplasia, no residual invasive carcinoma, focal residual DCIS measuring 16 mm, 0.2 mm from new lateral margin.   03/17/19 RIGHT nipple sparing mastectomy and level 1/2 ax node dissection with reconstruction (Dr. Renaldo Reel): focal, residual 3 mm DCIS, 6 mm from closest margin, no residual invasive cancer, ADH free of margins by 1.3 cm, 8 axillary nodes removed, all negative.   05/01/19 - 07/03/19 4 cycles TC adjuvant chemotherapy  06/20/19 Bone scan at [facility] given complaints of bone pain: negative  09/25/19 CT chest: negative  09/25/19 CT AP: negative  11/14/19 LEFT mammogram: heterogeneously dense, benign - BIRADS2  01/22/20 MR breast [facility]: Focal linear soft tissue and implant capsule thickening and enhancement along the inferior lateral aspect of the right breast implant. Probably benign and may represent  residual postoperative change, but   second-look ultrasound is recommended.  02/23/20 RIGHT breast US: BIRADS 3--PROBABLY BENIGN: Right breast implant in patient with prior mastectomy. In the right lateral breast/chest wall, there are multiple small cystic structures identified, ranging in size from 2 mm to 8 x 6 mm. These appear like anechoic cystic spaces on real-time ultrasound. This may represent postsurgical change in patient with prior complete mastectomy. Recommend six-month follow-up ultrasound to assure stability or resolution.  08/09/20 Right breast US: multiple cysts, BIRADS3 (probably benign), with recommendation for follow up ultrasound in 6 months.  11/22/20 B/L mammogram with dense breast tissue, otherwise benign.  03/29/21 [name] RIGHT breast: BIRADS3 - probably benign, recommend 6 month f/u ultrasound   03/31/21 DEXA: normal, T-score -0.5  10/10/21 Dx left breast mammo and right breast US at [facility], probably benign, 7 x 6 x 8 mm cyst at 1:00 right breast 9 cm from nipple, 5 x 4 mm cyst at 11:00 right breast 9 cm from nipple  04/17/22 [name] RIGHT breast: benign - birads2    ECOG performance status: 0    Objective:  Current medications:  No outpatient medications prior to visit.     No facility-administered medications prior to visit.       Allergies:  No Known Allergies    Review of Systems:  A 14 point review of systems was negative besides what I note in my HPI above.     Past Medical History:  Past Medical History:   Diagnosis Date   ? Breast cancer (HCC/RAF)        Past Surgical History:  Past Surgical History:   Procedure Laterality Date   ? BREAST FIBROADENOMA SURGERY Bilateral 1992   ? BREAST LUMPECTOMY Right 12/2018   ? BREAST LUMPECTOMY Bilateral 01/27/2019   ? CESAREAN SECTION  2006 & 2008   ? CHOLECYSTECTOMY  2004   ? KNEE SURGERY Left 2017    Ligament release    ? MASTECTOMY Right 03/2019   ? TONSILECTOMY, ADENOIDECTOMY, BILATERAL MYRINGOTOMY AND TUBES  1983   ? TUBAL LIGATION  2016       Social History:  Social History     Tobacco Use   ? Smoking status: Never   ? Smokeless tobacco: Never   Substance Use Topics   ? Alcohol use: Not Currently     Family History:  No family history on file.     Vitals:  BP: 124/79 (10/13 1456)  Temp: 36.5 ?C (97.7 ?F) (10/13 1456)  Temp Source: Oral (10/13 1456)  Heart Rate: 68 (10/13 1456)  Resp: 18 (10/13 1456)  SpO2: 96 % (10/13 1456)  Height: --  Weight: 66.1 kg (145 lb 12.8 oz) (10/13 1456)    Physical Exam:  General: Appears well-developed, well-nourished and close to stated age.   Head: Normocephalic, atraumatic.  Eyes: PERRL without icterus.   ENT: Oropharynx is clear, mucus membranes are moist.    CV: Regular in rate and rhythm, no murmurs or gallops.   Chest: Clear to auscultation bilaterally without wheezing or rhonchi.  Respiratory effort appears normal.   Abdomen: Soft, nontender and nondistended. Bowel sounds are present and normoactive. No organomegaly is appreciated  Musculoskeletal: No edema. No cyanosis. Extremities are warm and well-perfused.   Hematologic: No bruising, purpura or petechiae are noted.   Dermatologic: No rashes appreciated.   Lymphatic: No palpable cervical, supraclavicular, axillary or inguinal adenopathy appreciated.   Psychiatric: Affect appropriate.  Pleasant and conversant  Breast: B/L reconstruction appreciated,  no abnormal findings     Recent Labs:  Lab Results   Component Value Date    NA 139 05/11/2022    K 3.9 05/11/2022    CL 105 05/11/2022    CO2 30 05/11/2022    CREAT 0.76 05/11/2022    BUN 12 05/11/2022    GLUCOSE 102 05/11/2022    CALCIUM 9.5 05/11/2022    MG 1.6 07/03/2019     Lab Results   Component Value Date    WBC 6.9 05/11/2022    HGB 13.6 05/11/2022    HCT 39.2 05/11/2022    MCV 87.1 05/11/2022    PLT 266 05/11/2022    NEUTPCT 76.3 05/11/2022    NEUTABS 5,265 05/11/2022    MONOPCT 4.4 05/11/2022    MONOABS 304 05/11/2022     Lab Results   Component Value Date    TOTPRO 6.7 05/11/2022    ALBUMIN 4.5 05/11/2022    BILITOT 0.4 05/11/2022    BILICON <0.2 09/19/2019    AST 15 05/11/2022    ALT 13 05/11/2022    ALKPHOS 60 05/11/2022    GGT 42 09/12/2019     12/13/18: CEA = 0.8, [place] 27.29 = 27    Pertinent Imaging:  Per HPI     Pertinent Pathology:  Per HPI     Impression and Recommendations:  FLOY ANGERT is a 51 y.o. female with     1. 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   2. Myriad My Risk germline mutation analysis: negative    - reviewed breast ultrasound, copy provided for [facility] records  - repeat right mammogram in 6 months  - reviewed importance of diet and exercise in context of breast cancer specific outcomes    MEDICAL PROBLEMS       []  New minor or self-limited problem (2).  [x]  2+ minor or self-limited problem, stable chronic illness, acute uncomplicated illness/injury (3).  []  Chronic illness with exacerbation/progression/tx side effect; 2+ stable chronic illnesses, new problem with uncertain prognosis, acute illness with systemic symptoms, or acute complicated injury (4).  []  Chronic illness with severe exacerbation/progression/tx side effect, acute/chronic illness or injury that poses threat to life or bodily function (5).        REVIEW OF DATA       []  Reviewed/ordered []  1 []  2 [x]  ? 3 unique laboratory, radiology, and/or diagnostic tests      []  Reviewed []  1 []  2 []  ? 3 prior external notes and incorporated into patient assessment      []  Independent Test Interpretation or discussed management with external provider(s) on   As noted   RISK OF COMPLICATION      This writer has deemed the above diagnoses to have a risk of complication, morbidity or mortality of:   []  Minimal  []  Low  []  Moderate   []  Due to prescription drug management   []  Decision re: minor surgery/procedure   []  Diagnosis or treatment limited by social determinants of health  []  High    []  Due to intensive monitoring for medication/chemotherapy toxicity []  Decision elective surgery with risk  factors  []  Decision regarding need for hospitalization  []  Advanced Care Planning decisions          Return to clinic:  Return in about 6 months (around 11/11/2022) for see MD and review scans.    Orders:  Orders Placed This Encounter   ? Mammogram, diagnostic, left breast   ? gabapentin 300 mg capsule       Alric Seton. Zoila Shutter, MD  Assistant Professor of Medicine  Division of Hematology/Oncology  Phone: [phone]  Fax: [phone]  Email: [email or web address]    Attending Physician: Alric Seton. Zoila Shutter, MD.  Author: Alric Seton. Zoila Shutter, MD

## 2022-06-02 ENCOUNTER — Telehealth: Payer: Self-pay | Admitting: Family Medicine

## 2022-06-02 NOTE — Telephone Encounter (Signed)
Our office received a referral for pt to establish care. Tried to call to schedule an appt but no VM set up & no answer.

## 2022-08-16 ENCOUNTER — Ambulatory Visit: Payer: PRIVATE HEALTH INSURANCE

## 2022-10-31 ENCOUNTER — Telehealth: Payer: PRIVATE HEALTH INSURANCE

## 2022-10-31 NOTE — Telephone Encounter
Pt is scheduled for upcoming return visit with Dr. Alfonso Patten on 4/15.     The [place] insurance/FC team emailed [name] stating that this pt's [facility] PPO insurance is coming back as E-Rejected/not eligible.    Today 4/2, I called pt and LVM asking for callback to advise if she has had any insurance changes since the last time she saw [name].

## 2022-11-02 ENCOUNTER — Telehealth: Payer: PRIVATE HEALTH INSURANCE

## 2022-11-02 NOTE — Telephone Encounter
Orders Request    What is being requested? (Tests, Labs, Imaging, etc.):   Imaging: [name]  Reason for the request:   Pt is requesting an order is placed for [name] right breast. She states she wants this done at the same time of her mammogram which is scheduled for 12/15/2022  Where does the patient want to be seen?    [facility]  If outside [place], what is the fax number to the facility?    Pt does not have fax number  Has the patient seen their doctor for this matter?   Per pt, yes.  Last office visit:   05/12/2022  Patient or caller was offered an appointment but declined.    Patient or caller has been notified of the turnaround time of 1-2 business day(s).

## 2022-11-02 NOTE — Telephone Encounter
See diagnostic mammo order from Dr. Elvera Lennox from 05/12/22.     I called and spoke with pt. Informed her that Dr. Elvera Lennox is [place] this week but will return next Monday 4/8. I told pt I will forward her order request to Dr. Elvera Lennox, and we will reach out to the pt once we get a response from him next week. Pt understood.     Dr. Elvera Lennox, please see above and below. Please place US Breast order if appropriate. Thank you.

## 2022-11-06 ENCOUNTER — Telehealth: Payer: PRIVATE HEALTH INSURANCE

## 2022-11-06 ENCOUNTER — Ambulatory Visit: Payer: PRIVATE HEALTH INSURANCE

## 2022-11-06 DIAGNOSIS — Z170 Estrogen receptor positive status [ER+]: Secondary | ICD-10-CM

## 2022-11-06 DIAGNOSIS — C50911 Malignant neoplasm of unspecified site of right female breast: Secondary | ICD-10-CM

## 2022-11-06 NOTE — Telephone Encounter
4/8-US Rt Breast Diag, Jenna Newman    -All forms sent  F: [phone]   P: [phone]    -Pt aware to sched

## 2022-11-13 ENCOUNTER — Ambulatory Visit: Payer: PRIVATE HEALTH INSURANCE | Attending: Hematology & Oncology

## 2022-12-20 ENCOUNTER — Ambulatory Visit: Payer: PRIVATE HEALTH INSURANCE | Attending: Hematology & Oncology

## 2022-12-20 DIAGNOSIS — Z170 Estrogen receptor positive status [ER+]: Secondary | ICD-10-CM

## 2022-12-20 DIAGNOSIS — C50911 Malignant neoplasm of unspecified site of right female breast: Secondary | ICD-10-CM

## 2022-12-20 DIAGNOSIS — N6001 Solitary cyst of right breast: Secondary | ICD-10-CM

## 2022-12-20 NOTE — Patient Instructions
Please have your right chest ultrasound scheduled several days before your return visit

## 2022-12-20 NOTE — Progress Notes
Patient name:  Jenna Newman  MRN:  [id number]  DOB:  1971-05-02  CSN: [id number]  Date of encounter: 12/20/2022  Referring provider: Gloriann Loan., MD  PCP: Jacqulyn Liner, DO  Provider: Alric Seton. Zoila Shutter, MD      Subjective:  52 year old post- menopausal female with a history of menorrhagia status post laparoscopic bilateral tubal fulguration, hysteroscopy, dilatation and curettage, endometrial ablation December 2016 (no menses since, but with monthly pre menstrual symptoms until past year or so), and grade 2, 3.6 cm right sided hormone positive, HER-2 negative, sentinel node positive (1/10 nodes +) ductal breast cancer, status post mastectomy followed by reconstruction as noted below, Oncotype RS = 16, s/p 4 cycles adjuvant TC chemotherapy completed December 2020, initiating tamoxifen as opposed to OS/AI per patient choice Feb 2021, transitioning to AI in 2022, ultimately discontinuing endocrine therapy in July 2022 because of multiple AI toxicities, here in follow up. Recall quality of life markedly improved after stopping endocrine agent.    Had left breast mammo and right chest wall [name] 12/18/22 - see report below - radiology noted birads3, recommend repeat right chest ultrasound in 6 months.     Jenna Newman is doing well. 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 No complaints.     ONCOLOGY HISTORY:   11/27/18 b/l dx tomosynthesis mammo at [facility]: heterogeneously dense breast tissue. Distortion in the RUO breast peri-areolar region without suspicious calcifications.   11/27/18 RIGHT breast US: 15 x 16 x 22 mm mass at 11:00, 6 cm from the nipple, with a 1.4 cm extension of the mass off the superior aspect appearing continuous by a thin bridge of tissue. No frank right axillary adenopathy  12/06/18 US guided right breast bx: IDC, 8 mm, grade 2/3, SBR 7/9, ER/PR > 95%, Ki 67 25%, HER-2 FISH negative, with background grade 3 DCIS, cribriform pattern. No LCIS, no LVI, no microcalcifications  12/25/18 Genetics consultation: Myriad My Risk germline negative.   12/25/18 MRI breasts at [facility]: 2.8 x 2.1 x 2.2 cm mass at 11:00 axi right breast middle to anterior depth 5 cm from nipple, no e/o multi focal or multi centric or contralateral disease. No e/o nodal or metastatic dz seen.   12/31/18 Dr. Shanda Howells Surgical Oncology consult  01/27/19 RIGHT lumpectomy/SLN bx and LEFT breast reduction mammoplasty:   RIGHT: 3.6 cm IDC, grade 2/3, MBR 6/9, +LVI, no PNI, with 1 of 2 sentinel nodes positive for 8 mm metastatic cancer, with 3.5 cm DCIS, intermediate grade, comedo-type, cribriform and solid, with focally + CIS margins at superior/medial, invasive carcinoma margins uninvolved  LEFT: benign, no in situ or invasive dz.   02/17/19 RIGHT breast re-excision: UDH, apocrine metaplasia, no residual invasive carcinoma, focal residual DCIS measuring 16 mm, 0.2 mm from new lateral margin.   03/17/19 RIGHT nipple sparing mastectomy and level 1/2 ax node dissection with reconstruction (Dr. Renaldo Reel): focal, residual 3 mm DCIS, 6 mm from closest margin, no residual invasive cancer, ADH free of margins by 1.3 cm, 8 axillary nodes removed, all negative.   05/01/19 - 07/03/19 4 cycles TC adjuvant chemotherapy  06/20/19 Bone scan at [facility] given complaints of bone pain: negative  09/25/19 CT chest: negative  09/25/19 CT AP: negative  11/14/19 LEFT mammogram: heterogeneously dense, benign - BIRADS2  01/22/20 MR breast [facility]: Focal linear soft tissue and implant capsule thickening and enhancement along the inferior lateral aspect of the right breast implant. Probably benign and may represent residual postoperative change, but  second-look ultrasound is recommended.  02/23/20 RIGHT breast US: BIRADS 3--PROBABLY BENIGN: Right breast implant in patient with prior mastectomy. In the right lateral breast/chest wall, there are multiple small cystic structures identified, ranging in size from 2 mm to 8 x 6 mm. These appear like anechoic cystic spaces on real-time ultrasound. This may represent postsurgical change in patient with prior complete mastectomy. Recommend six-month follow-up ultrasound to assure stability or resolution.  08/09/20 Right breast US: multiple cysts, BIRADS3 (probably benign), with recommendation for follow up ultrasound in 6 months.  11/22/20 B/L mammogram with dense breast tissue, otherwise benign.  03/29/21 [name] RIGHT breast: BIRADS3 - probably benign, recommend 6 month f/u ultrasound   03/31/21 DEXA: normal, T-score -0.5  10/10/21 Dx left breast mammo and right breast US at [facility], probably benign, 7 x 6 x 8 mm cyst at 1:00 right breast 9 cm from nipple, 5 x 4 mm cyst at 11:00 right breast 9 cm from nipple    04/17/22 [name] RIGHT breast: benign - birads2    12/18/22 LEFT breast dx mammogram and RIGHT breast/chest wall [name]:      ECOG performance status: 0    Objective:  Current medications:  Outpatient Medications Prior to Visit   Medication Sig    gabapentin 300 mg capsule Take 1 capsule (300 mg total) by mouth at bedtime.     No facility-administered medications prior to visit.       Allergies:  No Known Allergies    Review of Systems:  A 14 point review of systems was negative besides what I note in my HPI above.     Past Medical History:  Past Medical History:   Diagnosis Date    Breast cancer (HCC/RAF)        Past Surgical History:  Past Surgical History:   Procedure Laterality Date    BREAST FIBROADENOMA SURGERY Bilateral 1992    BREAST LUMPECTOMY Right 12/2018    BREAST LUMPECTOMY Bilateral 01/27/2019    CESAREAN SECTION  2006 & 2008    CHOLECYSTECTOMY  2004    KNEE SURGERY Left 2017    Ligament release     MASTECTOMY Right 03/2019    TONSILECTOMY, ADENOIDECTOMY, BILATERAL MYRINGOTOMY AND TUBES  1983    TUBAL LIGATION  2016       Social History:  Social History     Tobacco Use    Smoking status: Never    Smokeless tobacco: Never   Substance Use Topics    Alcohol use: Not Currently     Family History:  No family history on file.     Vitals:  BP: 130/75 (05/22 1106)  Temp: 36.3 ?C (97.3 ?F) (05/22 1106)  Temp Source: Oral (05/22 1106)  Heart Rate: 67 (05/22 1106)  Resp: 18 (05/22 1106)  SpO2: 98 % (05/22 1106)  Height: --  Weight: 67.9 kg (149 lb 12.8 oz) (05/22 1106)    Physical Exam:  General: Appears well-developed, well-nourished and close to stated age.   Head: Normocephalic, atraumatic.  Eyes: PERRL without icterus.   ENT: Oropharynx is clear, mucus membranes are moist.    CV: Regular in rate and rhythm, no murmurs or gallops.   Chest: Clear to auscultation bilaterally without wheezing or rhonchi.  Respiratory effort appears normal.   Abdomen: Soft, nontender and nondistended. Bowel sounds are present and normoactive. No organomegaly is appreciated  Musculoskeletal: No edema. No cyanosis. Extremities are warm and well-perfused.   Hematologic: No bruising, purpura or petechiae are noted.  Dermatologic: No rashes appreciated.   Lymphatic: No palpable cervical, supraclavicular, axillary or inguinal adenopathy appreciated.   Psychiatric: Affect appropriate.  Pleasant and conversant  Breast: B/L reconstruction appreciated, no abnormal findings, cystic findings can be found in right chest wall, nothing suspicious on exam    Recent Labs:  Lab Results   Component Value Date    NA 139 05/11/2022    K 3.9 05/11/2022    CL 105 05/11/2022    CO2 30 05/11/2022    CREAT 0.76 05/11/2022    BUN 12 05/11/2022    GLUCOSE 102 05/11/2022    CALCIUM 9.5 05/11/2022    MG 1.6 07/03/2019     Lab Results   Component Value Date    WBC 6.9 05/11/2022    HGB 13.6 05/11/2022    HCT 39.2 05/11/2022    MCV 87.1 05/11/2022    PLT 266 05/11/2022    NEUTPCT 76.3 05/11/2022    NEUTABS 5,265 05/11/2022    MONOPCT 4.4 05/11/2022    MONOABS 304 05/11/2022     Lab Results   Component Value Date    TOTPRO 6.7 05/11/2022    ALBUMIN 4.5 05/11/2022    BILITOT 0.4 05/11/2022    BILICON <0.2 09/19/2019    AST 15 05/11/2022    ALT 13 05/11/2022    ALKPHOS 60 05/11/2022 GGT 42 09/12/2019     12/13/18: CEA = 0.8, [place] 27.29 = 27    Pertinent Imaging:  Per HPI     Pertinent Pathology:  Per HPI     Impression and Recommendations:  Jenna Newman is a 52 y.o. female with     1. 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     2. Myriad My Risk germline mutation analysis: negative    3. May 2024 Right chest wall [name] - BIRADS 3 - repeat chest wall [name] in 6 months, order placed    - reviewed breast imaging, copy provided for Jubilee's records  - have discussed importance of diet and exercise in context of breast cancer specific outcomes    MEDICAL PROBLEMS       []  New minor or self-limited problem (2).  [x]  2+ minor or self-limited problem, stable chronic illness, acute uncomplicated illness/injury (3).  []  Chronic illness with exacerbation/progression/tx side effect; 2+ stable chronic illnesses, new problem with uncertain prognosis, acute illness with systemic symptoms, or acute complicated injury (4).  []  Chronic illness with severe exacerbation/progression/tx side effect, acute/chronic illness or injury that poses threat to life or bodily function (5).        REVIEW OF DATA       []  Reviewed/ordered []  1 []  2 [x]  ? 3 unique laboratory, radiology, and/or diagnostic tests      []  Reviewed []  1 []  2 []  ? 3 prior external notes and incorporated into patient assessment      []  Independent Test Interpretation or discussed management with external provider(s) on   As noted   RISK OF COMPLICATION      This writer has deemed the above diagnoses to have a risk of complication, morbidity or mortality of:   []   Minimal  []  Low  []  Moderate   []  Due to prescription drug management   []  Decision re: minor surgery/procedure   []  Diagnosis or treatment limited by social determinants of health  []  High    []  Due to intensive monitoring for medication/chemotherapy toxicity   []  Decision elective surgery with risk factors  []  Decision regarding need for hospitalization  []  Advanced Care Planning decisions          Return to clinic:  Return in about 6 months (around 06/22/2023) for see MD and review scans.    Orders:  Orders Placed This Encounter    [name] right breast, diagnostic       Alric Seton. Zoila Shutter, MD  Assistant Professor of Medicine  Division of Hematology/Oncology  Phone: [phone]  Fax: [phone]  Email: [email or web address]    Attending Physician: Alric Seton. Zoila Shutter, MD.  Author: Alric Seton. Zoila Shutter, MD

## 2023-02-13 ENCOUNTER — Ambulatory Visit: Payer: PRIVATE HEALTH INSURANCE

## 2023-02-13 DIAGNOSIS — C50911 Malignant neoplasm of unspecified site of right female breast: Secondary | ICD-10-CM

## 2023-02-13 DIAGNOSIS — M5442 Lumbago with sciatica, left side: Secondary | ICD-10-CM

## 2023-02-13 DIAGNOSIS — R87619 Unspecified abnormal cytological findings in specimens from cervix uteri: Secondary | ICD-10-CM

## 2023-02-13 DIAGNOSIS — Z0000 Encounter for general adult medical examination without abnormal findings: Secondary | ICD-10-CM

## 2023-02-13 DIAGNOSIS — G8929 Other chronic pain: Secondary | ICD-10-CM

## 2023-02-13 DIAGNOSIS — J329 Chronic sinusitis, unspecified: Secondary | ICD-10-CM

## 2023-02-13 DIAGNOSIS — Z170 Estrogen receptor positive status [ER+]: Secondary | ICD-10-CM

## 2023-02-13 DIAGNOSIS — M5441 Lumbago with sciatica, right side: Secondary | ICD-10-CM

## 2023-02-13 NOTE — Patient Instructions
calmoseptine    -Saline Sinus rinse [employer] or [employer]) followed by [employer] twice a day for 2 weeks then once daily for another 2 weeks  -Allegra or Claritin Daily   -add on sudafed in day and benadryl at night as needed  -FOLLOW UP IF SYMPTOMS NOT IMPROVED BY 2 WEEKS TOTAL OR FEVER/WORSENING SYMPTOMS

## 2023-02-13 NOTE — Progress Notes
PATIENT: Jenna Newman  MRN: [id number]  DOB: [birth year]  DATE OF SERVICE: 02/13/2023    CHIEF COMPLAINT:   Chief Complaint   Patient presents with    Annual Exam     Abnormal PAP in January        HPI   Jenna Newman is a 52 y.o. female presents for   Chief Complaint   Patient presents with    Annual Exam     Abnormal PAP in January       MEDS     Medications that the patient states to be currently taking   Medication Sig    cetirizine (ZYRTEC ALLERGY) 10 MG CAPS Take 1 capsule by mouth as needed for.    ciprofloxacin-dexAMETHasone 0.3-0.1% otic suspension INSTILL 4 DROPS INTO AFFECTED EAR(S) BY OTIC ROUTE 2 TIMES PER DAY FOR 7 DAYS    gabapentin 300 mg capsule Take 1 capsule (300 mg total) by mouth at bedtime.    ibuprofen 200 mg tablet Take 1 tablet (200 mg total) by mouth every six (6) hours as needed.       VITALS     Last Recorded Vital Signs:    02/13/23 1111   BP: 119/73   Pulse: 59   Resp: 16   Temp: 36.8 ?C (98.3 ?F)   SpO2: 97%     Body mass index is 25.23 kg/m?Marland Kitchen    LABS/STUDIES     Lab Studies:    Imaging Studies:   No new imaging reviewed today        A&P   Jenna Newman is a 53 y.o. female presenting for   Chief Complaint   Patient presents with    Annual Exam     Abnormal PAP in January         ASSESSMENT and PLAN    Diagnoses and all orders for this visit:    Chronic bilateral low back pain without sciatica  Comments:  present 2 years, worsened over past 6 months and hx of cancer  Orders:  -     Referral to Orthopaedic Surgery, [facility]  -     MR lumbar spine wo contrast+nerve imaging; Future    Chronic sinusitis, unspecified location  -     Referral to Surgery, Head & Neck    Malignant neoplasm of right breast in female, estrogen receptor positive, unspecified site of breast (HCC/RAF)    Wellness examination  -     Cancel: Comprehensive Metabolic Panel; Future  -     Cancel: TSH with reflex FT4, FT3; Future  -     Cancel: CBC & Auto Differential; Future  -     Cancel: Lipid Panel; Future  -     Cancel: Hgb A1c; Future  -     Comprehensive Metabolic Panel; Future  -     TSH with reflex FT4, FT3; Future  -     CBC & Auto Differential; Future  -     Lipid Panel; Future  -     Hgb A1c; Future    Abnormal cervical Papanicolaou smear, unspecified abnormal pap finding  Comments:  PAP 6 months ago to be repeated now per outside gyn. exam with MA in room with normal findings.  Orders:  -     Pap smear 30+ yo (with HPV), Thinprep; Future  -     Pap smear 30+ yo (with HPV), Thinprep; Future        Patient Instructions   calmoseptine    -  Saline Sinus rinse [employer] or [employer]) followed by [employer] twice a day for 2 weeks then once daily for another 2 weeks  -Allegra or Claritin Daily   -add on sudafed in day and benadryl at night as needed  -FOLLOW UP IF SYMPTOMS NOT IMPROVED BY 2 WEEKS TOTAL OR FEVER/WORSENING SYMPTOMS       The above recommendation were discussed with the patient.  The patient has all questions answered satisfactorily and is in agreement with this recommended plan of care.    No follow-ups on file.   Author:  Almond Lint. Barnetta Chapel 02/13/2023 12:37 PM

## 2023-02-14 LAB — Differential Automated: ABSOLUTE EOS COUNT: 0.32 10*3/uL (ref 0.00–0.50)

## 2023-02-14 LAB — Comprehensive Metabolic Panel
BILIRUBIN,TOTAL: 0.4 mg/dL (ref 0.1–1.2)
POTASSIUM: 4.6 mmol/L (ref 3.6–5.3)

## 2023-02-14 LAB — CBC: NUCLEATED RBC%, AUTOMATED: 0 (ref 4.16–9.95)

## 2023-02-14 LAB — Hgb A1c: HGB A1C: 5.5 (ref <5.7)

## 2023-02-14 LAB — Lipid Panel: CHOLESTEROL: 185 mg/dL (ref >50–<150)

## 2023-02-14 LAB — TSH with reflex FT4, FT3: TSH: 1.6 u[IU]/mL (ref 0.3–4.7)

## 2023-02-22 LAB — HPV DNA PCR: HPV TYPE 16: NEGATIVE

## 2023-02-24 LAB — Liquid-based pap smear

## 2023-02-26 ENCOUNTER — Ambulatory Visit: Payer: PRIVATE HEALTH INSURANCE

## 2023-04-16 ENCOUNTER — Ambulatory Visit: Payer: PRIVATE HEALTH INSURANCE

## 2023-04-16 NOTE — Telephone Encounter
Per Portal and [facility] pt has not sched US Breast Right     Per prev msg, order faxed to [facility]     Per portal, they do not have our order; Re-faxed to [facility]  and Prev reports from mammo/us 12/18/22 and prev US breast right  from 04/27/22   For comparison since merger Jenna Newman to [facility]     Per orders:  [name] Due priro to FU 11/22/024    Sent courtesy reminder to pt via myChart.

## 2023-06-20 ENCOUNTER — Ambulatory Visit: Payer: PRIVATE HEALTH INSURANCE

## 2023-06-22 ENCOUNTER — Ambulatory Visit: Payer: PRIVATE HEALTH INSURANCE | Attending: Hematology & Oncology

## 2023-06-25 ENCOUNTER — Ambulatory Visit: Payer: PRIVATE HEALTH INSURANCE

## 2023-06-26 NOTE — Telephone Encounter
Per [facility] portal  Pt scheduled

## 2023-07-03 ENCOUNTER — Ambulatory Visit: Payer: PRIVATE HEALTH INSURANCE | Attending: Hematology & Oncology

## 2023-07-04 ENCOUNTER — Telehealth: Payer: PRIVATE HEALTH INSURANCE

## 2023-07-04 ENCOUNTER — Ambulatory Visit: Payer: PRIVATE HEALTH INSURANCE

## 2023-07-04 DIAGNOSIS — Z170 Estrogen receptor positive status [ER+]: Secondary | ICD-10-CM

## 2023-07-04 DIAGNOSIS — R92333 Heterogeneously dense tissue of both breasts on mammography: Secondary | ICD-10-CM

## 2023-07-04 DIAGNOSIS — C50911 Malignant neoplasm of unspecified site of right female breast: Secondary | ICD-10-CM

## 2023-07-04 NOTE — Telephone Encounter
12/04 MR breast wo+w contrast bilat, [facility]     Pt requested order to [facility], she will schedule     [facility]  PH: [phone]  FX: [phone]    Faxed: face sheet, MD PN & orders

## 2023-07-17 ENCOUNTER — Ambulatory Visit: Payer: PRIVATE HEALTH INSURANCE | Attending: Hematology & Oncology

## 2023-07-17 ENCOUNTER — Telehealth: Payer: PRIVATE HEALTH INSURANCE

## 2023-07-17 NOTE — Telephone Encounter
Called patient for her missed 10 AM. LVM for patient to call back and reschedule

## 2023-07-28 LAB — COLOGUARD: COLOGUARD: POSITIVE — AB

## 2023-07-28 LAB — EXTERNAL GENERIC LAB PROCEDURE: COLOGUARD: POSITIVE — AB

## 2023-08-13 ENCOUNTER — Encounter: Payer: Self-pay | Admitting: Internal Medicine

## 2023-08-15 NOTE — Telephone Encounter
Per [facility] portal nothing scheduled, sent pt mychart message to remind to sched appt.

## 2023-09-27 NOTE — Telephone Encounter
 Per [facility] portal nothing scheduled, sent pt mychart message to remind to sched appt. #rd and final reminder

## 2023-09-28 ENCOUNTER — Ambulatory Visit (AMBULATORY_SURGERY_CENTER): Payer: BLUE CROSS/BLUE SHIELD

## 2023-09-28 VITALS — Ht 69.0 in | Wt 215.0 lb

## 2023-09-28 DIAGNOSIS — Z1211 Encounter for screening for malignant neoplasm of colon: Secondary | ICD-10-CM

## 2023-09-28 MED ORDER — SUFLAVE 178.7 G PO SOLR: 1.0000 | Freq: Once | ORAL | 0 refills | Status: AC

## 2023-09-28 NOTE — Progress Notes (Signed)
 No egg or soy allergy known to patient  No issues known to pt with past sedation with any surgeries or procedures Patient denies ever being told they had issues or difficulty with intubation  No FH of Malignant Hyperthermia Pt is not on diet pills Pt is not on  home 02  Pt is not on blood thinners  Pt denies issues with constipation  No A fib or A flutter Have any cardiac testing pending--no Pt can ambulate independently Pt denies use of chewing tobacco Discussed diabetic I weight loss medication holds Discussed NSAID holds Checked BMI Pt instructed to use Single[employer] or GoodRx for a price reduction on prep  Patient's chart reviewed by Theresa Mitchell CNRA prior to previsit and patient appropriate for the [facility].  Pre visit completed and red dot placed by patient's name on their procedure day (on provider's schedule).

## 2023-10-12 ENCOUNTER — Encounter: Payer: Self-pay | Admitting: Internal Medicine

## 2023-10-12 ENCOUNTER — Telehealth: Payer: Self-pay | Admitting: Internal Medicine

## 2023-10-12 DIAGNOSIS — Z1211 Encounter for screening for malignant neoplasm of colon: Secondary | ICD-10-CM

## 2023-10-12 MED ORDER — NA SULFATE-K SULFATE-MG SULF 17.5-3.13-1.6 GM/177ML PO SOLN: 1.0000 | Freq: Once | ORAL | 0 refills | Status: AC

## 2023-10-12 NOTE — Telephone Encounter (Signed)
 Inbound call from patient, states she has not received her prep medication for upcoming procedure on 3/21 at 7:30 AM. States pharmacy has not received it, would like it resent.

## 2023-10-12 NOTE — Telephone Encounter (Signed)
 Rtn call to patient, VM obtained and message left that RN will send a RX for Suprep into her local pharmacy ([facility] on [address]).  Instructions resent to her mychart account.  Pt was instructed to call back if she had any questions.

## 2023-10-19 ENCOUNTER — Encounter: Payer: Self-pay | Admitting: Internal Medicine

## 2023-10-19 ENCOUNTER — Ambulatory Visit: Payer: BLUE CROSS/BLUE SHIELD | Admitting: Internal Medicine

## 2023-10-19 VITALS — BP 109/75 | HR 60 | Temp 98.1°F | Resp 10 | Ht 69.0 in | Wt 215.0 lb

## 2023-10-19 DIAGNOSIS — Z1211 Encounter for screening for malignant neoplasm of colon: Secondary | ICD-10-CM | POA: Diagnosis present

## 2023-10-19 DIAGNOSIS — D122 Benign neoplasm of ascending colon: Secondary | ICD-10-CM

## 2023-10-19 DIAGNOSIS — K648 Other hemorrhoids: Secondary | ICD-10-CM | POA: Diagnosis not present

## 2023-10-19 DIAGNOSIS — K6389 Other specified diseases of intestine: Secondary | ICD-10-CM | POA: Diagnosis not present

## 2023-10-19 DIAGNOSIS — D125 Benign neoplasm of sigmoid colon: Secondary | ICD-10-CM

## 2023-10-19 DIAGNOSIS — K635 Polyp of colon: Secondary | ICD-10-CM | POA: Diagnosis not present

## 2023-10-19 DIAGNOSIS — D123 Benign neoplasm of transverse colon: Secondary | ICD-10-CM

## 2023-10-19 NOTE — Progress Notes (Signed)
 GASTROENTEROLOGY PROCEDURE H&P NOTE   Primary Care Physician: Elder Negus, NP    Reason for Procedure:   Colon cancer screening  Plan:    Colonoscopy  Patient is appropriate for endoscopic procedure(s) in the ambulatory ([facility]) setting.  The nature of the procedure, as well as the risks, benefits, and alternatives were carefully and thoroughly reviewed with the patient. Ample time for discussion and questions allowed. The patient understood, was satisfied, and agreed to proceed.     HPI: Theresa Mitchell is a 53 y.o. female who presents for colonoscopy for colon cancer screening. Denies blood in stools, changes in bowel habits, or unintentional weight loss. Denies family history of colon cancer.  Past Medical History:  Diagnosis Date   Arthritis     Past Surgical History:  Procedure Laterality Date   FOOT SURGERY      Prior to Admission medications   Medication Sig Start Date End Date Taking? Authorizing Provider  cholecalciferol (VITAMIN D3) 25 MCG (1000 UNIT) tablet Take 1,000 Units by mouth daily.   Yes [provider]  Multiple Vitamins-Minerals (EQ MULTIVITAMINS ADULT GUMMY) CHEW Chew by mouth.   Yes [provider]  medroxyPROGESTERone (DEPO-PROVERA) 150 MG/ML injection Inject 150 mg into the muscle every 3 (three) months. Patient not taking: Reported on 10/19/2023    [provider]  meloxicam (MOBIC) 15 MG tablet Take 1 tablet (15 mg total) by mouth daily. Patient not taking: Reported on 10/19/2023 01/10/18   Hyatt, Max T, DPM  methylPREDNISolone (MEDROL DOSEPAK) 4 MG TBPK tablet 6 day dose pack - take as directed Patient not taking: Reported on 10/19/2023 01/10/18   Elinor Parkinson, DPM    Current Outpatient Medications  Medication Sig Dispense Refill   cholecalciferol (VITAMIN D3) 25 MCG (1000 UNIT) tablet Take 1,000 Units by mouth daily.     Multiple Vitamins-Minerals (EQ MULTIVITAMINS ADULT GUMMY) CHEW Chew by mouth.      medroxyPROGESTERone (DEPO-PROVERA) 150 MG/ML injection Inject 150 mg into the muscle every 3 (three) months. (Patient not taking: Reported on 10/19/2023)     meloxicam (MOBIC) 15 MG tablet Take 1 tablet (15 mg total) by mouth daily. (Patient not taking: Reported on 10/19/2023) 30 tablet 3   methylPREDNISolone (MEDROL DOSEPAK) 4 MG TBPK tablet 6 day dose pack - take as directed (Patient not taking: Reported on 10/19/2023) 21 tablet 0   No current facility-administered medications for this visit.    Allergies as of 10/19/2023 - Review Complete 10/19/2023  Allergen Reaction Noted   Penicillins Other (See Comments) 01/24/2016    Family History  Problem Relation Age of Onset   Hypertension Mother    Diabetes Mother    Hypertension Father    Cancer Maternal Aunt    Diabetes Paternal Aunt    Diabetes Maternal Grandmother    Hyperlipidemia Paternal Grandmother    Diabetes Paternal Grandmother    Colon cancer Neg Hx    Colon polyps Neg Hx    Esophageal cancer Neg Hx    Rectal cancer Neg Hx    Stomach cancer Neg Hx     Social History   Socioeconomic History   Marital status: Married    Spouse name: Not on file   Number of children: Not on file   Years of education: Not on file   Highest education level: Not on file  Occupational History   Not on file  Tobacco Use   Smoking status: Never   Smokeless tobacco: Never  Vaping Use  Vaping status: Every Day  Substance and Sexual Activity   Alcohol use: No   Drug use: Never   Sexual activity: Not on file  Other Topics Concern   Not on file  Social History Narrative   Not on file   Social Drivers of Health   Financial Resource Strain: Low Risk  (09/19/2023)   Received from [facility]   Overall Financial Resource Strain (CARDIA)    Difficulty of Paying Living Expenses: Not very hard  Food Insecurity: No Food Insecurity (09/19/2023)   Received from [facility]   Hunger Vital Sign    Worried About Running Out of Food in the  Last Year: Never true    Ran Out of Food in the Last Year: Never true  Transportation Needs: No Transportation Needs (09/19/2023)   Received from [facility] - Transportation    Lack of Transportation (Medical): No    Lack of Transportation (Non-Medical): No  Physical Activity: Unknown (09/19/2023)   Received from [facility]   Exercise Vital Sign    Days of Exercise per Week: 0 days    Minutes of Exercise per Session: Not on file  Stress: No Stress Concern Present (09/19/2023)   Received from [facility] - Occupational Stress Questionnaire    Feeling of Stress : Not at all  Social Connections: Socially Integrated (09/19/2023)   Received from [facility]   Social Network    How would you rate your social network (family, work, friends)?: Good participation with social networks  Intimate Partner Violence: Not At Risk (09/19/2023)   Received from [facility]   HITS    Over the last 12 months how often did your partner physically hurt you?: Never    Over the last 12 months how often did your partner insult you or talk down to you?: Never    Over the last 12 months how often did your partner threaten you with physical harm?: Never    Over the last 12 months how often did your partner scream or curse at you?: Never    Physical Exam: Vital signs in last 24 hours: BP 131/85   Pulse 66   Temp 98.1 F (36.7 C)   Ht 5\' 9"  (1.753 m)   Wt 215 lb (97.5 kg)   SpO2 96%   BMI 31.75 kg/m  GEN: NAD EYE: Sclerae anicteric ENT: MMM CV: Non-tachycardic Pulm: No increased work of breathing GI: Soft, NT/ND NEURO:  Alert & Oriented   Eulah Pont, MD [facility]  10/19/2023 8:02 AM

## 2023-10-19 NOTE — Progress Notes (Signed)
 Called to room to assist during endoscopic procedure.  Patient ID and intended procedure confirmed with present staff. Received instructions for my participation in the procedure from the performing physician.

## 2023-10-19 NOTE — Op Note (Signed)
 [facility] Patient Name: Theresa Mitchell Procedure Date: 10/19/2023 8:32 AM MRN: [id number] Endoscopist: Madelyn Brunner Tyrone , , [id number] Age: 53 Referring MD:  Date of Birth: 1970-12-19 Gender: Female Account #: [id number] Procedure:                Colonoscopy Indications:              Screening for colorectal malignant neoplasm, This                            is the patient's first colonoscopy Medicines:                Monitored Anesthesia Care Procedure:                Pre-Anesthesia Assessment:                           - Prior to the procedure, a History and Physical                            was performed, and patient medications and                            allergies were reviewed. The patient's tolerance of                            previous anesthesia was also reviewed. The risks                            and benefits of the procedure and the sedation                            options and risks were discussed with the patient.                            All questions were answered, and informed consent                            was obtained. Prior Anticoagulants: The patient has                            taken no anticoagulant or antiplatelet agents. ASA                            Grade Assessment: II - A patient with mild systemic                            disease. After reviewing the risks and benefits,                            the patient was deemed in satisfactory condition to                            undergo the procedure.  After obtaining informed consent, the colonoscope                            was passed under direct vision. Throughout the                            procedure, the patient's blood pressure, pulse, and                            oxygen saturations were monitored continuously. The                            CF [id number] #[id number] was introduced through the anus                            and advanced to  the the terminal ileum. The                            colonoscopy was performed without difficulty. The                            patient tolerated the procedure well. The quality                            of the bowel preparation was excellent. The                            terminal ileum, ileocecal valve, appendiceal                            orifice, and rectum were photographed. Scope In: 8:37:34 AM Scope Out: 8:56:40 AM Scope Withdrawal Time: 0 hours 14 minutes 21 seconds  Total Procedure Duration: 0 hours 19 minutes 6 seconds  Findings:                 The terminal ileum appeared normal.                           Three sessile polyps were found in the transverse                            colon and ascending colon. The polyps were 3 to 6                            mm in size. These polyps were removed with a cold                            snare. Resection was complete, but the polyp tissue                            was only partially retrieved.                           A 3 mm polyp was found in  the sigmoid colon. The                            polyp was sessile. The polyp was removed with a                            cold snare. Resection and retrieval were complete.                           Non-bleeding internal hemorrhoids were found during                            retroflexion. Complications:            No immediate complications. Estimated Blood Loss:     Estimated blood loss was minimal. Impression:               - The examined portion of the ileum was normal.                           - Three 3 to 6 mm polyps in the transverse colon                            and in the ascending colon, removed with a cold                            snare. Complete resection. Partial retrieval.                           - One 3 mm polyp in the sigmoid colon, removed with                            a cold snare. Resected and retrieved.                           - Non-bleeding  internal hemorrhoids. Recommendation:           - Discharge patient to home (with escort).                           - Await pathology results.                           - The findings and recommendations were discussed                            with the patient. Dr Particia Lather "Theresa" Mitchell,  10/19/2023 9:00:44 AM

## 2023-10-19 NOTE — Progress Notes (Signed)
 Pt's states no medical or surgical changes since previsit or office visit.

## 2023-10-19 NOTE — Progress Notes (Signed)
 Report to PACU, RN, vss, BBS= Clear.

## 2023-10-19 NOTE — Patient Instructions (Signed)
 Resume all of your previous medications today as ordered.  Read your discharge isntructions.  YOU HAD AN ENDOSCOPIC PROCEDURE TODAY AT THE [facility]:   Refer to the procedure report that was given to you for any specific questions about what was found during the examination.  If the procedure report does not answer your questions, please call your gastroenterologist to clarify.  If you requested that your care partner not be given the details of your procedure findings, then the procedure report has been included in a sealed envelope for you to review at your convenience later.  YOU SHOULD EXPECT: Some feelings of bloating in the abdomen. Passage of more gas than usual.  Walking can help get rid of the air that was put into your GI tract during the procedure and reduce the bloating. If you had a lower endoscopy (such as a colonoscopy or flexible sigmoidoscopy) you may notice spotting of blood in your stool or on the toilet paper. If you underwent a bowel prep for your procedure, you may not have a normal bowel movement for a few days.  Please Note:  You might notice some irritation and congestion in your nose or some drainage.  This is from the oxygen used during your procedure.  There is no need for concern and it should clear up in a day or so.  SYMPTOMS TO REPORT IMMEDIATELY:  Following lower endoscopy (colonoscopy or flexible sigmoidoscopy):  Excessive amounts of blood in the stool  Significant tenderness or worsening of abdominal pains  Swelling of the abdomen that is new, acute  Fever of 100F or higher   For urgent or emergent issues, a gastroenterologist can be reached at any hour by calling [phone]. Do not use MyChart messaging for urgent concerns.    DIET:  We do recommend a small meal at first, but then you may proceed to your regular diet.  Drink plenty of fluids but you should avoid alcoholic beverages for 24 hours.  ACTIVITY:  You should plan to take it easy  for the rest of today and you should NOT DRIVE or use heavy machinery until tomorrow (because of the sedation medicines used during the test).    FOLLOW UP: Our staff will call the number listed on your records the next business day following your procedure.  We will call around 7:15- 8:00 am to check on you and address any questions or concerns that you may have regarding the information given to you following your procedure. If we do not reach you, we will leave a message.     If any biopsies were taken you will be contacted by phone or by letter within the next 1-3 weeks.  Please call us at [phone] if you have not heard about the biopsies in 3 weeks.    SIGNATURES/CONFIDENTIALITY: You and/or your care partner have signed paperwork which will be entered into your electronic medical record.  These signatures attest to the fact that that the information above on your After Visit Summary has been reviewed and is understood.  Full responsibility of the confidentiality of this discharge information lies with you and/or your care-partner.

## 2023-10-22 ENCOUNTER — Telehealth: Payer: Self-pay | Admitting: *Deleted

## 2023-10-22 NOTE — Telephone Encounter (Signed)
 Attempted post procedure follow up call.  No answer - LVM.

## 2023-10-24 LAB — SURGICAL PATHOLOGY

## 2023-10-25 ENCOUNTER — Encounter: Payer: Self-pay | Admitting: Internal Medicine

## 2023-11-08 ENCOUNTER — Telehealth: Payer: PRIVATE HEALTH INSURANCE

## 2023-11-08 NOTE — Telephone Encounter
 PDL Call to Clinic    Reason for Call:Steven from [facility] says the MD submitted an MRI order but they have no auth on file and would like us  to sumbit the info verbally right now.    Appointment Related?  []  Yes  [x]  No     If yes;  Date:  Time:    Call warm transferred to PDL: [x]  Yes  []  No    Call Received by Clinic Representative:Lisa    If call not answered/not accepted, call received by Patient Services Representative:

## 2023-11-08 NOTE — Telephone Encounter
 S/w Landon Pinion from [facility] submits for auth.    Provided [facility] info-he will contact.

## 2023-11-13 NOTE — Telephone Encounter
 PDL Call to Clinic    Reason for Call: Marice from [facility] calling for same reason Landon Pinion called last week says they have no info on [facility] requesting info    Appointment Related?  []  Yes  [x]  No     If yes;  Date:  Time:    Call warm transferred to PDL: [x]  Yes  []  No    Call Received by Clinic Representative: Edwina Gram    If call not answered/not accepted, call received by Patient Services Representative:

## 2023-11-13 NOTE — Telephone Encounter
 ONCE again provided info for [facility].

## 2023-11-30 ENCOUNTER — Telehealth: Payer: PRIVATE HEALTH INSURANCE

## 2023-11-30 NOTE — Telephone Encounter
 Call Back Request      Reason for call back:     Pt returning call from Keri,  Pt stated that she contacted [address] and that pt needs an MRI Authorization.    CB # [phone]    Any Symptoms:  []  Yes  [x]  No      If yes, what symptoms are you experiencing:    Duration of symptoms (how long):    Have you taken medication for symptoms (OTC or Rx):      If call was taken outside of clinic hours:    [] Patient or caller has been notified that this message was sent outside of normal clinic hours.     [] Patient or caller has been warm transferred to the physician's answering service. If applicable, patient or caller informed to please call us  back if symptoms progress.  Patient or caller has been notified of the turnaround time of 1-2 business day(s).

## 2023-11-30 NOTE — Telephone Encounter
 Call Back Request      Reason for call back:  Pt asking to speak with office regarding MRI authorization.     CBN: [phone]     Thank you     Any Symptoms:  []  Yes  [x]  No      If yes, what symptoms are you experiencing:    Duration of symptoms (how long):    Have you taken medication for symptoms (OTC or Rx):      If call was taken outside of clinic hours:    [] Patient or caller has been notified that this message was sent outside of normal clinic hours.     [] Patient or caller has been warm transferred to the physician's answering service. If applicable, patient or caller informed to please call us  back if symptoms progress.  Patient or caller has been notified of the turnaround time of 1-2 business day(s).

## 2023-12-14 ENCOUNTER — Inpatient Hospital Stay: Payer: PRIVATE HEALTH INSURANCE

## 2023-12-14 DIAGNOSIS — Z719 Counseling, unspecified: Secondary | ICD-10-CM

## 2023-12-19 ENCOUNTER — Telehealth: Payer: PRIVATE HEALTH INSURANCE

## 2023-12-19 NOTE — Telephone Encounter
 The patient is requesting vv for MR results is this okay

## 2023-12-27 ENCOUNTER — Telehealth: Payer: PRIVATE HEALTH INSURANCE

## 2023-12-27 ENCOUNTER — Other Ambulatory Visit: Payer: PRIVATE HEALTH INSURANCE

## 2023-12-27 ENCOUNTER — Ambulatory Visit: Payer: PRIVATE HEALTH INSURANCE | Attending: Hematology & Oncology

## 2023-12-27 DIAGNOSIS — Z170 Estrogen receptor positive status [ER+]: Secondary | ICD-10-CM

## 2023-12-27 DIAGNOSIS — R92333 Heterogeneously dense tissue of both breasts on mammography: Secondary | ICD-10-CM

## 2023-12-27 DIAGNOSIS — C50911 Malignant neoplasm of unspecified site of right female breast: Secondary | ICD-10-CM

## 2023-12-27 DIAGNOSIS — C773 Secondary and unspecified malignant neoplasm of axilla and upper limb lymph nodes: Secondary | ICD-10-CM

## 2023-12-27 NOTE — Telephone Encounter
 05/29 PETCT with Attenuation Correction CT (ACCT) only; [facility], [facility]    Per MD cc notes: she requested PET/CT. I placed order, she can schedule at her convenience. She understands this will likely be denied by insurance and she may pay cash.     [facility]  PH: [phone]  FX: [phone]    Faxed: face sheet, ins card, MD PN, orders, labs & path

## 2023-12-27 NOTE — Telephone Encounter
 05/29  US  right breast, diagnostic, Mammogram, diagnostic, left breast, [facility]    Per MD mammogram and breast ultrasound now (first available) the pt was advised     [facility]  PH: [phone]  FX: [phone]    Faxed: face sheet, ins card, MD PN, orders & path

## 2023-12-27 NOTE — Telephone Encounter
 Per MD, Please let me know when mammogram and ultrasound are scheduled, and if she is going to have the PET/CT, which will dictate timing of my follow up.

## 2023-12-27 NOTE — Telephone Encounter
 05/29 MR breast wo+w contrast bilat, [facility]    The pt aware is to schedule aprox 06/28/2024  per MD     [facility]  PH: [phone]  FX: [phone]    Faxed: face sheet, ins card, MD PN, orders & path

## 2023-12-27 NOTE — Progress Notes
 Patient Consent to Telehealth   The patient agreed to participate in the video visit prior to joining the visit.      Patient name:  Jenna Newman  MRN:  [id number]  DOB:  10/27/1970  CSN: [id number]  Date of encounter: 12/27/2023  Referring provider: No ref. 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 Recall quality of life markedly improved after stopping endocrine agent.    Had breast ultrasound in December 2024 and bilateral breast MRI this month. Both reviewed with [facility]. BIRADS3.     Jenna Newman has understandable trepidation about the possibility of late onset, recurrent breast cancer.     She would like a PET/CT at this time.     She has occasional aches and pains, but is without specific headaches, vision changes, cardiopulmonary issues, bone pain, anorexia, rapid weight loss, chest wall/breast changes, etc.     ONCOLOGY HISTORY:   11/27/18 b/l dx tomosynthesis mammo at [facility]: heterogeneously dense breast tissue. Distortion in the RUO breast peri-areolar region without suspicious calcifications.   11/27/18 RIGHT breast US : 15 x 16 x 22 mm mass at 11:00, 6 cm from the nipple, with a 1.4 cm extension of the mass off the superior aspect appearing continuous by a thin bridge of tissue. No frank right axillary adenopathy  12/06/18 US  guided right breast bx: IDC, 8 mm, grade 2/3, SBR 7/9, ER/PR > 95%, Ki 67 25%, HER-2 FISH negative, with background grade 3 DCIS, cribriform pattern. No LCIS, no LVI, no microcalcifications  12/25/18 Genetics consultation: Myriad My Risk germline negative.   12/25/18 MRI breasts at [facility]: 2.8 x 2.1 x 2.2 cm mass at 11:00 axi right breast middle to anterior depth 5 cm from nipple, no e/o multi focal or multi centric or contralateral disease. No e/o nodal or metastatic dz seen.   12/31/18 Dr. Rendall Carpenter Surgical Oncology consult  01/27/19 RIGHT lumpectomy/SLN bx and LEFT breast reduction mammoplasty:   RIGHT: 3.6 cm IDC, grade 2/3, MBR 6/9, +LVI, no PNI, with 1 of 2 sentinel nodes positive for 8 mm metastatic cancer, with 3.5 cm DCIS, intermediate grade, comedo-type, cribriform and solid, with focally + CIS margins at superior/medial, invasive carcinoma margins uninvolved  LEFT: benign, no in situ or invasive dz.   02/17/19 RIGHT breast re-excision: UDH, apocrine metaplasia, no residual invasive carcinoma, focal residual DCIS measuring 16 mm, 0.2 mm from new lateral margin.   03/17/19 RIGHT nipple sparing mastectomy and level 1/2 ax node dissection with reconstruction (Dr. Ashok Laws): focal, residual 3 mm DCIS, 6 mm from closest margin, no residual invasive cancer, ADH free of margins by 1.3 cm, 8 axillary nodes removed, all negative.   05/01/19 - 07/03/19 4 cycles TC adjuvant chemotherapy  06/20/19 Bone scan at [facility] given complaints of bone pain: negative  09/25/19 CT chest: negative  09/25/19 CT AP: negative  11/14/19 LEFT mammogram: heterogeneously dense, benign - BIRADS2  01/22/20 MR breast [facility]: Focal linear soft tissue and implant capsule thickening and enhancement along the inferior lateral aspect of the right breast implant. Probably benign and may represent residual postoperative change, but   second-look ultrasound is recommended.  02/23/20 RIGHT breast US : BIRADS 3--PROBABLY BENIGN: Right breast implant in patient with prior mastectomy. In the right lateral breast/chest wall, there are multiple small cystic structures identified, ranging in size from 2 mm to 8 x 6 mm. These appear like anechoic cystic spaces on real-time ultrasound. This may represent postsurgical change in patient with prior complete mastectomy. Recommend six-month follow-up ultrasound to assure stability or resolution.  08/09/20 Right breast US : multiple cysts, BIRADS3 (probably benign), with recommendation for follow up ultrasound in 6 months.  11/22/20 B/L mammogram with dense breast tissue, otherwise benign.  03/29/21 US  RIGHT breast: BIRADS3 - probably benign, recommend 6 month f/u ultrasound   03/31/21 DEXA: normal, T-score -0.5  10/10/21 Dx left breast mammo and right breast US  at [facility]: BIRADS3, probably benign, 7 x 6 x 8 mm cyst at 1:00 right breast 9 cm from nipple, 5 x 4 mm cyst at 11:00 right breast 9 cm from nipple    04/17/22 US  RIGHT breast: benign - birads2    12/18/22 LEFT breast dx mammogram and RIGHT breast/chest wall US :      07/04/23 US  Right breast        12/14/23 MR b/l breast        ECOG performance status: 0    Objective:  Current medications:  Outpatient Medications Prior to Visit   Medication Sig    cetirizine (ZYRTEC ALLERGY) 10 MG CAPS Take 1 capsule by mouth as needed for.    ciprofloxacin-dexAMETHasone 0.3-0.1% otic suspension INSTILL 4 DROPS INTO AFFECTED EAR(S) BY OTIC ROUTE 2 TIMES PER DAY FOR 7 DAYS    ibuprofen 200 mg tablet Take 1 tablet (200 mg total) by mouth every six (6) hours as needed.     No facility-administered medications prior to visit.       Allergies:  No Known Allergies    Review of Systems:  A 14 point review of systems was negative besides what I note in my HPI above.     Past Medical History:  Past Medical History:   Diagnosis Date    Breast cancer (HCC/RAF)        Past Surgical History:  Past Surgical History:   Procedure Laterality Date    BREAST FIBROADENOMA SURGERY Bilateral 1992    BREAST LUMPECTOMY Right 12/2018    BREAST LUMPECTOMY Bilateral 01/27/2019    CESAREAN SECTION  2006 & 2008    CHOLECYSTECTOMY  2004    KNEE SURGERY Left 2017    Ligament release     MASTECTOMY Right 03/2019    TONSILECTOMY, ADENOIDECTOMY, BILATERAL MYRINGOTOMY AND TUBES  1983    TUBAL LIGATION  2016       Social History:  Social History     Tobacco Use    Smoking status: Never    Smokeless tobacco: Never   Substance Use Topics    Alcohol use: Not Currently     Family History:  No family history on file.     Telemed visit, prior exam left for historical purposes    Vitals:  BP: --  Temp: --  Temp Source: --  Heart Rate: --  Resp: --  SpO2: --  Height: --  Weight: --    Physical Exam:  General: Appears well-developed, well-nourished and close to stated age.   Head: Normocephalic, atraumatic.  Eyes: PERRL without icterus.   ENT: Oropharynx is clear, mucus membranes are moist.    CV: Regular in rate and rhythm, no murmurs or gallops.   Chest: Clear to auscultation bilaterally without wheezing or rhonchi.  Respiratory effort appears normal.   Abdomen: Soft, nontender and nondistended. Bowel sounds are present and normoactive. No organomegaly is appreciated  Musculoskeletal: No edema. No cyanosis. Extremities are warm and well-perfused.   Hematologic: No bruising, purpura or petechiae are noted.   Dermatologic: No rashes appreciated.   Lymphatic: No palpable cervical, supraclavicular, axillary or inguinal adenopathy appreciated.   Psychiatric: Affect appropriate.  Pleasant and conversant  Breast: B/L reconstruction appreciated, no abnormal findings, cystic findings can be found in right chest wall, nothing suspicious on exam    Recent Labs:  Lab Results   Component Value Date    NA 141 02/13/2023    K 4.6 02/13/2023    CL 104 02/13/2023    CO2 25 02/13/2023    CREAT 0.82 02/13/2023    BUN 13 02/13/2023    GLUCOSE 88 02/13/2023    CALCIUM 9.8 02/13/2023    MG 1.6 07/03/2019     Lab Results   Component Value Date    WBC 5.81 02/13/2023    HGB 14.5 02/13/2023    HCT 45.0 02/13/2023    MCV 90.4 02/13/2023    PLT 304 02/13/2023    NEUTPCT 63.1 02/13/2023    NEUTABS 3.66 02/13/2023    MONOPCT 6.2 02/13/2023    MONOABS 0.36 02/13/2023     Lab Results   Component Value Date    TOTPRO 7.4 02/13/2023    ALBUMIN 4.7 02/13/2023    BILITOT 0.4 02/13/2023    BILICON <0.2 09/19/2019    AST 26 02/13/2023    ALT 25 02/13/2023    ALKPHOS 87 02/13/2023    GGT 42 09/12/2019     12/13/18: CEA = 0.8, [place] 27.29 = 27    Pertinent Imaging:  Per HPI     Pertinent Pathology:  Per HPI     Impression and Recommendations:  JAX ABDELRAHMAN is a 53 y.o. female with     1. 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     2. Myriad My Risk germline mutation analysis: negative    3. BIRADS 3 breast MRI May 2025 - radiology recommends repeat MRI 6 months    - reviewed breast imaging  - left mammogram and right breast ultrasound due now  - repeat MR breast in 6 months  - requesting PET/CT after pro/con discussed with [facility], she is hoping a clean PET would allay worries and improve quality of life  - labs now, orders sent to [facility], including CA 27.29  - discussed pros/cons of ctDNA analysis with [facility]     MEDICAL PROBLEMS       []  New minor or self-limited problem (2).  []  2+ minor or self-limited problem, stable chronic illness, acute uncomplicated illness/injury (3).  [x]  Chronic illness with exacerbation/progression/tx side effect; 2+ stable chronic illnesses, new  problem with uncertain prognosis, acute illness with systemic symptoms, or acute complicated injury (4).  []  Chronic illness with severe exacerbation/progression/tx side effect, acute/chronic illness or injury that poses threat to life or bodily function (5).        REVIEW OF DATA       [x]  Reviewed/ordered []  1 []  2 [x]  >= 3 unique laboratory, radiology, and/or diagnostic tests      []  Reviewed []  1 []  2 []  >= 3 prior external notes and incorporated into patient assessment      []  Independent Test Interpretation or discussed management with external provider(s) on   As noted   RISK OF COMPLICATION      This writer has deemed the above diagnoses to have a risk of complication, morbidity or mortality of:   []  Minimal  []  Low  []  Moderate   []  Due to prescription drug management   []  Decision re: minor surgery/procedure   []  Diagnosis or treatment limited by social determinants of health  []  High    []  Due to intensive monitoring for medication/chemotherapy toxicity   []  Decision elective surgery with risk factors  []  Decision regarding need for hospitalization  []  Advanced Care Planning decisions          Return to clinic:  Return for see MD after repeat breast imaging.    Orders:  Orders Placed This Encounter    PETCT with Attenuation Correction CT (ACCT) only; [facility]    MR breast wo+w contrast bilat    Mammogram, diagnostic, left breast    US  right breast, diagnostic    Comprehensive Metabolic Panel    CBC & Auto Differential    CA27.29       Otilia Bloch. Claryce Cruel, MD  Assistant Professor of Medicine  Division of Hematology/Oncology  Phone: [phone]  Fax: [phone]  Email: [email or web address]    Attending Physician: Otilia Bloch. Claryce Cruel, MD.  Author: Otilia Bloch. Claryce Cruel, MD

## 2023-12-27 NOTE — Telephone Encounter
 S/w Leah from [facility]-The patient is scheduled on 01/02/2024 (check in time: 8:15 am) Lvm to inform appt details      [facility]    [address], [address]  [place], [address]

## 2023-12-28 NOTE — Telephone Encounter
 S/w pt and confirmed she will be attending appointment.

## 2024-01-18 NOTE — Telephone Encounter
 Mammo/US  done on 6/4 in [facility], no MRI or pet scheduled @ [facility] or [facility]. I spoke to pt and she said she still wants to have pet and will call [facility] today to check status of authorization. will check again with pt next week to see status. Staff message sent to Dr. JONELLE to keep him in the loop

## 2024-01-28 NOTE — Telephone Encounter
 S/w pt has not scheduled PetCT requested I re-fax orders to [facility] advised to please let us  know when she is scheduled so we can schedule a return visit per MD request.

## 2024-01-28 NOTE — Telephone Encounter
 Re-faxed.

## 2024-02-06 ENCOUNTER — Other Ambulatory Visit: Payer: PRIVATE HEALTH INSURANCE

## 2024-02-06 NOTE — Telephone Encounter
 Per [facility] portal

## 2024-02-13 ENCOUNTER — Inpatient Hospital Stay: Payer: PRIVATE HEALTH INSURANCE

## 2024-02-13 DIAGNOSIS — Z719 Counseling, unspecified: Secondary | ICD-10-CM

## 2024-02-14 ENCOUNTER — Other Ambulatory Visit: Payer: PRIVATE HEALTH INSURANCE

## 2024-02-21 ENCOUNTER — Ambulatory Visit: Payer: PRIVATE HEALTH INSURANCE | Attending: Hematology & Oncology

## 2024-02-21 ENCOUNTER — Telehealth: Payer: PRIVATE HEALTH INSURANCE

## 2024-02-21 DIAGNOSIS — Z170 Estrogen receptor positive status [ER+]: Principal | ICD-10-CM

## 2024-02-21 DIAGNOSIS — C50911 Malignant neoplasm of unspecified site of right female breast: Secondary | ICD-10-CM

## 2024-02-21 NOTE — Progress Notes
 Patient Consent to Telehealth   The patient agreed to participate in the video visit prior to joining the visit.      Patient name:  Jenna Newman  MRN:  [id number]  DOB:  October 23, 1970  CSN: [id number]  Date of encounter: 02/21/2024  Referring provider: Cecillia Fonda BIRCH., MD  PCP: Cheryn Elouise FALCON., DO  Provider: Fonda CORDOBA Cecillia, MD      Subjective:  53 year old post- menopausal female with a history of menorrhagia status post laparoscopic bilateral tubal fulguration, hysteroscopy, dilatation and curettage, endometrial ablation December 2016, grade 2, 3.6 cm right sided hormone positive, HER-2 negative, sentinel node positive (1/10 nodes +) ductal breast cancer, status post mastectomy followed by reconstruction as noted below, Oncotype RS = 16, s/p 4 cycles adjuvant TC chemotherapy completed December 2020, initiating tamoxifen  as opposed to OS/AI per patient choice Feb 2021, transitioning to AI in 2022, ultimately discontinuing endocrine therapy in July 2022 because of multiple AI toxicities, here in video follow up. Recall quality of life markedly improved after stopping endocrine agent.    Had left mammo and ultrasound last month, negative.    Had PET/CT last week - also negative. No e/o disease recurrence or mets.     Jenna Newman is doing well. Occasional aches and pains with menopausal symptoms, but is without specific headaches, vision changes, cardiopulmonary issues, bone pain, anorexia, rapid weight loss, chest wall/breast changes, etc.     ONCOLOGY HISTORY:   11/27/18 b/l dx tomosynthesis mammo at [facility]: heterogeneously dense breast tissue. Distortion in the RUO breast peri-areolar region without suspicious calcifications.   11/27/18 RIGHT breast US : 15 x 16 x 22 mm mass at 11:00, 6 cm from the nipple, with a 1.4 cm extension of the mass off the superior aspect appearing continuous by a thin bridge of tissue. No frank right axillary adenopathy  12/06/18 US  guided right breast bx: IDC, 8 mm, grade 2/3, SBR 7/9, ER/PR > 95%, Ki 67 25%, HER-2 FISH negative, with background grade 3 DCIS, cribriform pattern. No LCIS, no LVI, no microcalcifications  12/25/18 Genetics consultation: Myriad My Risk germline negative.   12/25/18 MRI breasts at [facility]: 2.8 x 2.1 x 2.2 cm mass at 11:00 axi right breast middle to anterior depth 5 cm from nipple, no e/o multi focal or multi centric or contralateral disease. No e/o nodal or metastatic dz seen.   12/31/18 Dr. [facility] consult  01/27/19 RIGHT lumpectomy/SLN bx and LEFT breast reduction mammoplasty:   RIGHT: 3.6 cm IDC, grade 2/3, MBR 6/9, +LVI, no PNI, with 1 of 2 sentinel nodes positive for 8 mm metastatic cancer, with 3.5 cm DCIS, intermediate grade, comedo-type, cribriform and solid, with focally + CIS margins at superior/medial, invasive carcinoma margins uninvolved  LEFT: benign, no in situ or invasive dz.   02/17/19 RIGHT breast re-excision: UDH, apocrine metaplasia, no residual invasive carcinoma, focal residual DCIS measuring 16 mm, 0.2 mm from new lateral margin.   03/17/19 RIGHT nipple sparing mastectomy and level 1/2 ax node dissection with reconstruction (Dr. Josepha): focal, residual 3 mm DCIS, 6 mm from closest margin, no residual invasive cancer, ADH free of margins by 1.3 cm, 8 axillary nodes removed, all negative.   05/01/19 - 07/03/19 4 cycles TC adjuvant chemotherapy  06/20/19 Bone scan at [facility] given complaints of bone pain: negative  09/25/19 CT chest: negative  09/25/19 CT AP: negative  11/14/19 LEFT mammogram: heterogeneously dense, benign - BIRADS2  01/22/20 MR breast Edwena: Focal linear soft tissue  and implant capsule thickening and enhancement along the inferior lateral aspect of the right breast implant. Probably benign and may represent residual postoperative change, but   second-look ultrasound is recommended.  02/23/20 RIGHT breast US : BIRADS 3--PROBABLY BENIGN: Right breast implant in patient with prior mastectomy. In the right lateral breast/chest wall, there are multiple small cystic structures identified, ranging in size from 2 mm to 8 x 6 mm. These appear like anechoic cystic spaces on real-time ultrasound. This may represent postsurgical change in patient with prior complete mastectomy. Recommend six-month follow-up ultrasound to assure stability or resolution.  08/09/20 Right breast US : multiple cysts, BIRADS3 (probably benign), with recommendation for follow up ultrasound in 6 months.  11/22/20 B/L mammogram with dense breast tissue, otherwise benign.  03/29/21 US  RIGHT breast: BIRADS3 - probably benign, recommend 6 month f/u ultrasound   03/31/21 DEXA: normal, T-score -0.5  10/10/21 Dx left breast mammo and right breast US  at [facility]: BIRADS3, probably benign, 7 x 6 x 8 mm cyst at 1:00 right breast 9 cm from nipple, 5 x 4 mm cyst at 11:00 right breast 9 cm from nipple    04/17/22 US  RIGHT breast: benign - birads2    12/18/22 LEFT breast dx mammogram and RIGHT breast/chest wall US :      07/04/23 US  Right breast        12/14/23 MR b/l breast      01/02/24 Dx LEFT mammo and US           02/13/24 PET/CT        ECOG performance status: 0    Objective:  Current medications:  Outpatient Medications Prior to Visit   Medication Sig    cetirizine (ZYRTEC ALLERGY) 10 MG CAPS Take 1 capsule by mouth as needed for.    ciprofloxacin-dexAMETHasone  0.3-0.1% otic suspension INSTILL 4 DROPS INTO AFFECTED EAR(S) BY OTIC ROUTE 2 TIMES PER DAY FOR 7 DAYS    ibuprofen 200 mg tablet Take 1 tablet (200 mg total) by mouth every six (6) hours as needed.     No facility-administered medications prior to visit.       Allergies:  No Known Allergies    Review of Systems:  A 14 point review of systems was negative besides what I note in my HPI above.     Past Medical History:  Past Medical History:   Diagnosis Date    Breast cancer (HCC/RAF)        Past Surgical History:  Past Surgical History:   Procedure Laterality Date    BREAST FIBROADENOMA SURGERY Bilateral 1992 BREAST LUMPECTOMY Right 12/2018    BREAST LUMPECTOMY Bilateral 01/27/2019    CESAREAN SECTION  2006 & 2008    CHOLECYSTECTOMY  2004    KNEE SURGERY Left 2017    Ligament release     MASTECTOMY Right 03/2019    TONSILECTOMY, ADENOIDECTOMY, BILATERAL MYRINGOTOMY AND TUBES  1983    TUBAL LIGATION  2016       Social History:  Social History     Tobacco Use    Smoking status: Never    Smokeless tobacco: Never   Substance Use Topics    Alcohol use: Not Currently     Family History:  No family history on file.     Telemed visit, prior exam left for historical purposes    Vitals:  BP: --  Temp: --  Temp Source: --  Heart Rate: --  Resp: --  SpO2: --  Height: --  Weight: --  Physical Exam:  General: Appears well-developed, well-nourished and close to stated age.   Head: Normocephalic, atraumatic.  Eyes: PERRL without icterus.   ENT: Oropharynx is clear, mucus membranes are moist.    CV: Regular in rate and rhythm, no murmurs or gallops.   Chest: Clear to auscultation bilaterally without wheezing or rhonchi.  Respiratory effort appears normal.   Abdomen: Soft, nontender and nondistended. Bowel sounds are present and normoactive. No organomegaly is appreciated  Musculoskeletal: No edema. No cyanosis. Extremities are warm and well-perfused.   Hematologic: No bruising, purpura or petechiae are noted.   Dermatologic: No rashes appreciated.   Lymphatic: No palpable cervical, supraclavicular, axillary or inguinal adenopathy appreciated.   Psychiatric: Affect appropriate.  Pleasant and conversant  Breast: B/L reconstruction appreciated, no abnormal findings, cystic findings can be found in right chest wall, nothing suspicious on exam    Recent Labs:  Lab Results   Component Value Date    NA 141 02/11/2024    K 4.3 02/11/2024    CL 105 02/11/2024    CO2 27 02/11/2024    CREAT 0.71 02/11/2024    BUN 13 02/11/2024    GLUCOSE 99 02/11/2024    CALCIUM 9.7 02/11/2024    MG 1.6 07/03/2019     Lab Results   Component Value Date    WBC 6.4 02/11/2024    HGB 14.1 02/11/2024    HCT 43.6 02/11/2024    MCV 92.4 02/11/2024    PLT 280 02/11/2024    NEUTPCT 64.8 02/11/2024    NEUTABS 4,147 02/11/2024    MONOPCT 6.2 02/11/2024    MONOABS 397 02/11/2024     Lab Results   Component Value Date    TOTPRO 7.1 02/11/2024    ALBUMIN 4.7 02/11/2024    BILITOT 0.4 02/11/2024    BILICON <0.2 09/19/2019    AST 24 02/11/2024    ALT 24 02/11/2024    ALKPHOS 73 02/11/2024    GGT 42 09/12/2019     12/13/18: CEA = 0.8, [place] 27.29 = 27    Pertinent Imaging:  Per HPI     Pertinent Pathology:  Per HPI     Impression and Recommendations:  KATHLEAN CINCO is a 53 y.o. female with     1. 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     2. Myriad My Risk germline mutation analysis: negative    3. BIRADS 3 breast MRI May 2025 - radiology recommended repeat MRI 6 months    - reviewed last months breast imaging results, in addition to recent labs and PET/CT  - repeat MR breast prior to RV  - have discussed pros/cons of ctDNA analysis with [facility]     MEDICAL PROBLEMS       []  New minor or self-limited problem (2).  [x]  2+ minor or self-limited problem, stable chronic illness, acute uncomplicated illness/injury (3).  []  Chronic illness with exacerbation/progression/tx side effect; 2+ stable chronic illnesses, new problem with uncertain prognosis, acute illness with systemic symptoms, or acute complicated injury (4).  []  Chronic illness with severe exacerbation/progression/tx side effect, acute/chronic illness or injury that poses threat  to life or bodily function (5).        REVIEW OF DATA       [x]  Reviewed/ordered []  1 []  2 [x]  >= 3 unique laboratory, radiology, and/or diagnostic tests      []  Reviewed []  1 []  2 []  >= 3 prior external notes and incorporated into patient assessment      []  Independent Test Interpretation or discussed management with external provider(s) on   As noted   RISK OF COMPLICATION      This writer has deemed the above diagnoses to have a risk of complication, morbidity or mortality of:   []  Minimal  []  Low  []  Moderate   []  Due to prescription drug management   []  Decision re: minor surgery/procedure   []  Diagnosis or treatment limited by social determinants of health  []  High    []  Due to intensive monitoring for medication/chemotherapy toxicity   []  Decision elective surgery with risk factors  []  Decision regarding need for hospitalization  []  Advanced Care Planning decisions          Return to clinic:  Return in about 6 months (around 08/23/2024) for see MD and review scans.    Orders:  Orders Placed This Encounter    MR breast wo+w contrast bilat       Fonda D. Cecillia, MD  Assistant Professor of Medicine  Division of Hematology/Oncology  Phone: [phone]  Fax: [phone]  Email: [email or web address]    Attending Physician: Fonda BIRCH. Cecillia, MD.  Author: Fonda BIRCH. Cecillia, MD

## 2024-02-21 NOTE — Telephone Encounter
 07/24 MR breast wo+w contrast bilat, [facility]     The pt aware is to schedule 08/23/24    [facility]   PH: [phone]  FX: [phone]    Faxed: Face sheet, ins card, MD PN, orders & path

## 2024-03-17 ENCOUNTER — Telehealth: Payer: PRIVATE HEALTH INSURANCE

## 2024-03-17 NOTE — Telephone Encounter
 Call Back Request      Reason for call back: Jenna calling from [employer] to advise that patients PET CT PA is pending for denial.     Jenna Newman   Reference # [id number]  CBN [phone]  Option 4     Any Symptoms:  []  Yes  [x]  No      If yes, what symptoms are you experiencing:    Duration of symptoms (how long):    Have you taken medication for symptoms (OTC or Rx):      If call was taken outside of clinic hours:    [] Patient or caller has been notified that this message was sent outside of normal clinic hours.     [] Patient or caller has been warm transferred to the physician's answering service. If applicable, patient or caller informed to please call us  back if symptoms progress.  Patient or caller has been notified of the turnaround time of 1-2 business day(s).

## 2024-06-23 NOTE — Telephone Encounter
 Nothing scheduled per [facility] portal.    Called pt reached VM, lm to remind to call [facility] to make appt for exam at least 2 weeks prior to 08/23/24 appt

## 2024-06-24 ENCOUNTER — Encounter: Payer: Self-pay | Admitting: Podiatry

## 2024-06-24 ENCOUNTER — Ambulatory Visit (INDEPENDENT_AMBULATORY_CARE_PROVIDER_SITE_OTHER): Admitting: Podiatry

## 2024-06-24 ENCOUNTER — Ambulatory Visit (INDEPENDENT_AMBULATORY_CARE_PROVIDER_SITE_OTHER)

## 2024-06-24 DIAGNOSIS — M76821 Posterior tibial tendinitis, right leg: Secondary | ICD-10-CM

## 2024-06-24 DIAGNOSIS — M76822 Posterior tibial tendinitis, left leg: Secondary | ICD-10-CM

## 2024-06-24 DIAGNOSIS — Z789 Other specified health status: Secondary | ICD-10-CM | POA: Insufficient documentation

## 2024-06-24 DIAGNOSIS — M722 Plantar fascial fibromatosis: Secondary | ICD-10-CM

## 2024-06-24 DIAGNOSIS — Q666 Other congenital valgus deformities of feet: Secondary | ICD-10-CM

## 2024-06-24 MED ORDER — METHYLPREDNISOLONE 4 MG PO TBPK: ORAL_TABLET | ORAL | 0 refills | Status: AC

## 2024-06-24 MED ORDER — MELOXICAM 15 MG PO TABS: 15.0000 mg | ORAL_TABLET | Freq: Every day | ORAL | 3 refills | Status: AC

## 2024-06-24 MED ORDER — TRIAMCINOLONE ACETONIDE 40 MG/ML IJ SUSP
20.0000 mg | Freq: Once | INTRAMUSCULAR | Status: AC
  Administered 2024-06-24: 20 mg

## 2024-06-24 NOTE — Progress Notes (Signed)
 Subjective:  Patient ID: Theresa Mitchell, female    DOB: 05-17-1971,  MRN: [id number] HPI Chief Complaint  Patient presents with   Ankle Pain    Medial foot/ankle bilateral (L>R) - aching, swelling x years, treated in 2019 for tendonitis and was given an ankle brace, still wears the brace when it gets real painful, takes Tylenol/Ibuprofen as needed, notices now the pain goes all the way around the ankles at times   New Patient (Initial Visit)    Est pt 2019    53 y.o. female presents with the above complaint.   ROS: Denies fever chills nausea vomiting muscle aches pains calf pain back pain chest pain shortness of breath.  Past Medical History:  Diagnosis Date   Arthritis    Past Surgical History:  Procedure Laterality Date   FOOT SURGERY      Current Outpatient Medications:    meloxicam  (MOBIC ) 15 MG tablet, Take 1 tablet (15 mg total) by mouth daily., Disp: 30 tablet, Rfl: 3   methylPREDNISolone  (MEDROL  DOSEPAK) 4 MG TBPK tablet, 6 day dose pack - take as directed, Disp: 21 tablet, Rfl: 0   cholecalciferol (VITAMIN D3) 25 MCG (1000 UNIT) tablet, Take 1,000 Units by mouth daily., Disp: , Rfl:   Allergies  Allergen Reactions   Penicillins Other (See Comments)   Review of Systems Objective:  There were no vitals filed for this visit.  General: Well developed, nourished, in no acute distress, alert and oriented x3   Dermatological: Skin is warm, dry and supple bilateral. Nails x 10 are well maintained; remaining integument appears unremarkable at this time. There are no open sores, no preulcerative lesions, no rash or signs of infection present.  Vascular: Dorsalis Pedis artery and Posterior Tibial artery pedal pulses are 2/4 bilateral with immedate capillary fill time. Pedal hair growth present. No varicosities and no lower extremity edema present bilateral.   Neruologic: Grossly intact via light touch bilateral. Vibratory intact via tuning fork bilateral. Protective  threshold with Semmes Wienstein monofilament intact to all pedal sites bilateral. Patellar and Achilles deep tendon reflexes 2+ bilateral. No Babinski or clonus noted bilateral.   Musculoskeletal: Pes planovalgus bilateral with pain on palpation of the posterior tibial tendon left over right pain with palpation of the dorsal lateral aspect at the tarsometatarsal joint., crepitus, or limitation noted with foot and ankle range of motion bilateral. Muscular strength 5/5 in all groups tested bilateral.  Moderate to severe pain on palpation medial calcaneal tubercle bilateral.  Gait: Unassisted, Nonantalgic.    Radiographs:  Radiographs taken today bilateral views 3 views demonstrate osseously to her individual with significant osteopenia and demineralization of the bone.  Single K wires are retained to the head of the first metatarsal most likely from transpositional capital osteotomies.  She has significant pes planovalgus.  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  Both heels do demonstrate plantar distally oriented calcaneal spurs with soft tissue increase in density at the plantar fascial calcaneal insertion site.  Assessment & Plan:   Assessment: Pes planovalgus with plantar fasciitis resulting in posterior tibial tendinitis and synovitis of her right foot.  Most likely from compensation.  Plan: Discussed etiology pathology conservative versus surgical therapies at this point I injected her bilateral heels 20 mg of Kenalog  5 mg Marcaine to the point of maximal tenderness.  I also started her on methylprednisolone  to be followed by meloxicam  and discussed appropriate shoe gear.  Discussed the fact that  we may need to see about getting her in with our orthotic group.     Jaevon Paras T. [place], [place]

## 2024-07-02 NOTE — Telephone Encounter
 Per [facility] portal pt has not scheduled.    LVM courtesy reminder to call [facility] to sched 2wks prior to fu

## 2024-07-25 ENCOUNTER — Other Ambulatory Visit: Payer: PRIVATE HEALTH INSURANCE

## 2024-08-05 ENCOUNTER — Ambulatory Visit (INDEPENDENT_AMBULATORY_CARE_PROVIDER_SITE_OTHER): Admitting: Podiatry

## 2024-08-05 DIAGNOSIS — Q666 Other congenital valgus deformities of feet: Secondary | ICD-10-CM

## 2024-08-05 DIAGNOSIS — M76822 Posterior tibial tendinitis, left leg: Secondary | ICD-10-CM | POA: Diagnosis not present

## 2024-08-05 DIAGNOSIS — M76821 Posterior tibial tendinitis, right leg: Secondary | ICD-10-CM

## 2024-08-05 NOTE — Progress Notes (Signed)
 She presents today for follow-up of her plantar fasciitis and her pes planus and posterior tibial tendinitis states she is about 50% better but she still has soreness and throbbing in the foot.  Objective: Vital signs are stable alert and oriented x 3 physical exam does demonstrate foot flexible flatfoot deformity with posterior tibial tendinitis and plantar fasciitis left greater than right.  Assessment: The aforementioned plantar fasciitis pes planus and posterior tibial tendinitis.  Plan: Get her into a set of orthotics continue any anti-inflammatories during the day follow-up with me in a month or so or when her orthotics come in.

## 2024-08-05 NOTE — Progress Notes (Signed)
 Visit:  ORTHOTIC SCAN/ EVALUATION  Patient presented for evaluation/ scan for custom molded foot orthotics as recommended by Dr. Verta at her visit with him today.  Patient will benefit from custom foot orthotics to provide total contact to bilateral medial longitudinal arches to help balance and distribute body weight more evenly.  Thus reducing plantar pressure and pain.   Orthotic will encourage forefoot and rearfoot alignment.    Patient was scanned today with O[facility] scanner.    Orthotics are ordered.  Signature obtained for notification of pricing/ fees for the device.  When the orthotic is ready for pick up, will call to make an appointment for a fitting.    Janye Maynor, DPM

## 2024-08-06 ENCOUNTER — Other Ambulatory Visit: Payer: PRIVATE HEALTH INSURANCE

## 2024-08-06 NOTE — Telephone Encounter
 Per [facility] portal nothing scheduled. Noticed [facility] message sent on 12/26, so I have called lm for pt advising courtesy reminder to call [facility] to schedule her MRI at least 2 weeks pior to her return on 1/29. I did advised if this scan is not done in appropriate time we may have to rs her appt.

## 2024-08-07 NOTE — Telephone Encounter
 Pt my chart message        I have called [facility] scheduling [phone] spoke to [facility] order has been received, unable to schedule without auth, he has emailed his auth team to submit and will call pt to schedule once authorized.     Replied back to pt as well.

## 2024-08-14 DIAGNOSIS — Z170 Estrogen receptor positive status [ER+]: Secondary | ICD-10-CM

## 2024-08-14 DIAGNOSIS — C50911 Malignant neoplasm of unspecified site of right female breast: Secondary | ICD-10-CM

## 2024-08-15 ENCOUNTER — Telehealth: Payer: Self-pay | Admitting: Podiatry

## 2024-08-15 ENCOUNTER — Inpatient Hospital Stay: Payer: PRIVATE HEALTH INSURANCE | Attending: Hematology & Oncology

## 2024-08-15 NOTE — Telephone Encounter (Signed)
 Left a VM; please call back to schedule an appt to [facility] are ready in the [place] office

## 2024-08-27 NOTE — Progress Notes
 Patient Consent to Telehealth   The patient agreed to participate in the video visit prior to joining the visit.      Patient name:  Jenna Newman  MRN:  [id number]  DOB:  1970-10-09  CSN: [id number]  Date of encounter: 08/28/2024  Referring provider: No ref. 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 Recall quality of life markedly improved after stopping endocrine agent.    Had interval surveillance MRI breasts earlier this month - see below - negative, BIRADS2    Ione has had a lot of stress over the past several months. Her parents passed away within a week of each other sadly in November. She is having understandable stress and some anxiety. She started low dose tirzepatide. Has had some intermittent bouts of nausea, no frank emesis, felt unrelated to this medication. No current nausea or vomiting, fevers, sweats, chills, anorexia, rapid weight loss, bone pain. Does have occasional headaches without acute strength or sensation changes, vision changes, etc.     No breast/chest wall concerns.     ONCOLOGY HISTORY:   11/27/18 b/l dx tomosynthesis mammo at [facility]: heterogeneously dense breast tissue. Distortion in the RUO breast peri-areolar region without suspicious calcifications.   11/27/18 RIGHT breast US : 15 x 16 x 22 mm mass at 11:00, 6 cm from the nipple, with a 1.4 cm extension of the mass off the superior aspect appearing continuous by a thin bridge of tissue. No frank right axillary adenopathy  12/06/18 US  guided right breast bx: IDC, 8 mm, grade 2/3, SBR 7/9, ER/PR > 95%, Ki 67 25%, HER-2 FISH negative, with background grade 3 DCIS, cribriform pattern. No LCIS, no LVI, no microcalcifications  12/25/18 Genetics consultation: Myriad My Risk germline negative.   12/25/18 MRI breasts at [facility]: 2.8 x 2.1 x 2.2 cm mass at 11:00 axi right breast middle to anterior depth 5 cm from nipple, no e/o multi focal or multi centric or contralateral disease. No e/o nodal or metastatic dz seen.   12/31/18 Dr. [facility] consult  01/27/19 RIGHT lumpectomy/SLN bx and LEFT breast reduction mammoplasty:   RIGHT: 3.6 cm IDC, grade 2/3, MBR 6/9, +LVI, no PNI, with 1 of 2 sentinel nodes positive for 8 mm metastatic cancer, with 3.5 cm DCIS, intermediate grade, comedo-type, cribriform and solid, with focally + CIS margins at superior/medial, invasive carcinoma margins uninvolved  LEFT: benign, no in situ or invasive dz.   02/17/19 RIGHT breast re-excision: UDH, apocrine metaplasia, no residual invasive carcinoma, focal residual DCIS measuring 16 mm, 0.2 mm from new lateral margin.   03/17/19 RIGHT nipple sparing mastectomy and level 1/2 ax node dissection with reconstruction (Dr. Josepha): focal, residual 3 mm DCIS, 6 mm from closest margin, no residual invasive cancer, ADH free of margins by 1.3 cm, 8 axillary nodes removed, all negative.   05/01/19 -  07/03/19 4 cycles TC adjuvant chemotherapy  06/20/19 Bone scan at [facility] given complaints of bone pain: negative  09/25/19 CT chest: negative  09/25/19 CT AP: negative  11/14/19 LEFT mammogram: heterogeneously dense, benign - BIRADS2  01/22/20 MR breast Edwena: Focal linear soft tissue and implant capsule thickening and enhancement along the inferior lateral aspect of the right breast implant. Probably benign and may represent residual postoperative change, but   second-look ultrasound is recommended.  02/23/20 RIGHT breast US : BIRADS 3--PROBABLY BENIGN: Right breast implant in patient with prior mastectomy. In the right lateral breast/chest wall, there are multiple small cystic structures identified, ranging in size from 2 mm to 8 x 6 mm. These appear like anechoic cystic spaces on real-time ultrasound. This may represent postsurgical change in patient with prior complete mastectomy. Recommend six-month follow-up ultrasound to assure stability or resolution.  08/09/20 Right breast US : multiple cysts, BIRADS3 (probably benign), with recommendation for follow up ultrasound in 6 months.  11/22/20 B/L mammogram with dense breast tissue, otherwise benign.  03/29/21 US  RIGHT breast: BIRADS3 - probably benign, recommend 6 month f/u ultrasound   03/31/21 DEXA: normal, T-score -0.5  10/10/21 Dx left breast mammo and right breast US  at [facility], probably benign, 7 x 6 x 8 mm cyst at 1:00 right breast 9 cm from nipple, 5 x 4 mm cyst at 11:00 right breast 9 cm from nipple    04/17/22 US  RIGHT breast: benign - birads2    12/18/22 LEFT breast dx mammogram and RIGHT breast/chest wall US :      07/04/23 US  Right breast        12/14/23 MR b/l breast      01/02/24 Dx LEFT mammo and US           02/13/24 PET/CT      08/14/24 MRI b/l breasts       ECOG performance status: 0    Objective:  Current medications:  Outpatient Medications Prior to Visit   Medication Sig    cetirizine (ZYRTEC ALLERGY) 10 MG CAPS Take 1 capsule by mouth as needed for.    ciprofloxacin-dexAMETHasone  0.3-0.1% otic suspension INSTILL 4 DROPS INTO AFFECTED EAR(S) BY OTIC ROUTE 2 TIMES PER DAY FOR 7 DAYS    ibuprofen 200 mg tablet Take 1 tablet (200 mg total) by mouth every six (6) hours as needed.     No facility-administered medications prior to visit.       Allergies:  No Known Allergies    Review of Systems:  A 14 point review of systems was negative besides what I note in my HPI above.     Past Medical History:  Past Medical History:   Diagnosis Date    Breast cancer (HCC/RAF)        Past Surgical History:  Past Surgical History:   Procedure Laterality Date    BREAST FIBROADENOMA SURGERY Bilateral 1992    BREAST LUMPECTOMY Right 12/2018    BREAST LUMPECTOMY Bilateral 01/27/2019    CESAREAN SECTION  2006 & 2008    CHOLECYSTECTOMY  2004    KNEE SURGERY Left 2017    Ligament release     MASTECTOMY Right 03/2019    TONSILECTOMY, ADENOIDECTOMY, BILATERAL MYRINGOTOMY AND TUBES  1983    TUBAL LIGATION  2016       Social History:  Social History     Tobacco Use    Smoking status: Never    Smokeless tobacco: Never   Substance Use Topics    Alcohol use: Not  Currently     Family History:  No family history on file.     Telemed visit, prior exam left for historical purposes    Vitals:  BP: --  Temp: --  Temp Source: --  Heart Rate: --  Resp: --  SpO2: --  Height: --  Weight: --    Physical Exam:  General: Appears well-developed, well-nourished and close to stated age.   Head: Normocephalic, atraumatic.  Eyes: PERRL without icterus.   ENT: Oropharynx is clear, mucus membranes are moist.    CV: Regular in rate and rhythm, no murmurs or gallops.   Chest: Clear to auscultation bilaterally without wheezing or rhonchi.  Respiratory effort appears normal.   Abdomen: Soft, nontender and nondistended. Bowel sounds are present and normoactive. No organomegaly is appreciated  Musculoskeletal: No edema. No cyanosis. Extremities are warm and well-perfused.   Hematologic: No bruising, purpura or petechiae are noted.   Dermatologic: No rashes appreciated.   Lymphatic: No palpable cervical, supraclavicular, axillary or inguinal adenopathy appreciated.   Psychiatric: Affect appropriate.  Pleasant and conversant  Breast: B/L reconstruction appreciated, no abnormal findings, cystic findings can be found in right chest wall, nothing suspicious on exam    Recent Labs:  Lab Results   Component Value Date    NA 141 02/11/2024    K 4.3 02/11/2024    CL 105 02/11/2024    CO2 27 02/11/2024    CREAT 0.71 02/11/2024    BUN 13 02/11/2024    GLUCOSE 99 02/11/2024    CALCIUM 9.7 02/11/2024    MG 1.6 07/03/2019     Lab Results   Component Value Date    WBC 6.4 02/11/2024    HGB 14.1 02/11/2024    HCT 43.6 02/11/2024    MCV 92.4 02/11/2024    PLT 280 02/11/2024    NEUTPCT 64.8 02/11/2024    NEUTABS 4,147 02/11/2024    MONOPCT 6.2 02/11/2024    MONOABS 397 02/11/2024     Lab Results   Component Value Date    TOTPRO 7.1 02/11/2024    ALBUMIN 4.7 02/11/2024    BILITOT 0.4 02/11/2024    BILICON <0.2 09/19/2019    AST 24 02/11/2024    ALT 24 02/11/2024    ALKPHOS 73 02/11/2024    GGT 42 09/12/2019     12/13/18: CEA = 0.8, [place] 27.29 = 27    Pertinent Imaging:  Per HPI     Pertinent Pathology:  Per HPI     Impression and Recommendations:  LARI LINSON is a 54 y.o. female with     1. 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     2. Myriad My Risk germline mutation analysis: negative    3. Dense breast tissue    4. Occasional anxiety, nausea, headaches - non specific, not persistent, not worsening. Requesting labs, [facility] to [facility]. Will return to clinic for visit if signs/symptoms persist or worsen for additional workup.     - reviewed MRI   - mammogram and US  on left in 6 months  - return precautions discussed  - have discussed pros/cons of  ctDNA analysis with [facility] previously     MEDICAL PROBLEMS       []  New minor or self-limited problem (2).  []  2+ minor or self-limited problem, stable chronic illness, acute uncomplicated illness/injury (3).  [x]  Chronic illness with exacerbation/progression/tx side effect; 2+ stable chronic illnesses, new problem with uncertain prognosis, acute illness with systemic symptoms, or acute complicated injury (4).  []  Chronic illness with severe exacerbation/progression/tx side effect, acute/chronic illness or injury that poses threat to life or bodily function (5).        REVIEW OF DATA       [x]  Reviewed/ordered []  1 []  2 [x]  >= 3 unique laboratory, radiology, and/or diagnostic tests      []  Reviewed []  1 []  2 []  >= 3 prior external notes and incorporated into patient assessment      []  Independent Test Interpretation or discussed management with external provider(s) on   As noted   RISK OF COMPLICATION      This writer has deemed the above diagnoses to have a risk of complication, morbidity or mortality of:   []  Minimal  []  Low  []  Moderate   []  Due to prescription drug management   []  Decision re: minor surgery/procedure   []  Diagnosis or treatment limited by social determinants of health  []  High    []  Due to intensive monitoring for medication/chemotherapy toxicity   []  Decision elective surgery with risk factors  []  Decision regarding need for hospitalization  []  Advanced Care Planning decisions          Return to clinic:  Return in about 6 months (around 02/25/2025) for see MD and review scans.    Orders:  Orders Placed This Encounter    Mammogram, diagnostic, left breast    US  left breast, diagnostic    Comprehensive Metabolic Panel    CBC & Auto Differential    CA27.29    TSH with reflex FT4, FT3    Hgb A1c       Fonda BIRCH. Cecillia, MD  Assistant Professor of Medicine  Division of Hematology/Oncology  Phone: [phone]  Fax: [phone]  Email: [email or web address]    Attending Physician: Fonda BIRCH. Cecillia, MD.  Author: Fonda BIRCH. Cecillia, MD

## 2024-08-28 ENCOUNTER — Telehealth: Payer: PRIVATE HEALTH INSURANCE

## 2024-08-28 ENCOUNTER — Ambulatory Visit: Payer: PRIVATE HEALTH INSURANCE | Attending: Hematology & Oncology

## 2024-08-28 DIAGNOSIS — R4589 Other symptoms and signs involving emotional state: Secondary | ICD-10-CM

## 2024-08-28 DIAGNOSIS — R519 Generalized headaches: Secondary | ICD-10-CM

## 2024-08-28 DIAGNOSIS — Z170 Estrogen receptor positive status [ER+]: Secondary | ICD-10-CM

## 2024-08-28 DIAGNOSIS — R92333 Heterogeneously dense tissue of both breasts on mammography: Secondary | ICD-10-CM

## 2024-08-28 DIAGNOSIS — C773 Secondary and unspecified malignant neoplasm of axilla and upper limb lymph nodes: Secondary | ICD-10-CM

## 2024-08-28 DIAGNOSIS — R110 Nausea: Secondary | ICD-10-CM

## 2024-08-28 DIAGNOSIS — C50911 Malignant neoplasm of unspecified site of right female breast: Principal | ICD-10-CM

## 2024-08-28 NOTE — Telephone Encounter
 [facility] Orders   Name of order:   - US  left breast, diagnostic (Order [id number]) & Mammogram, diagnostic, left breast (Order [id number])       Facility:   [] - [place]  [] - [facility]  [x] - [facility]  [] - [facility]  [] - Other (Please list name below)   Date/Time/Arrival   -  Future Order Information    Expected Expires      02/25/2025 (Approximate) 10/26/2025         Prep   - [facility]  PH: [phone]  FX: [phone]   Previous Images needed?   [] - Yes  [x] - No   Records faxed (what was faxed and fax #)   - facesheets, ins card, orders, progress notes, no recent labs, no recent path    Auth Status:   [] - Approved  [] - Denied  [] - Requested  [x] - Facility to obtain   Additional Comments:  Patient is aware of referral. Patient is aware to self schedule at [facility]. Advised patient to have scans and labs done prior to appointment scheduled on 02/26/25. Both orders to be done at [facility].
